# Patient Record
Sex: Female | Born: 1995 | Race: Black or African American | Hispanic: No | Marital: Single
Health system: Midwestern US, Community
[De-identification: ages and names within clinical notes are randomized; demographics above are authoritative.]

## PROBLEM LIST (undated history)

## (undated) DIAGNOSIS — C801 Malignant (primary) neoplasm, unspecified: Secondary | ICD-10-CM

## (undated) HISTORY — PX: ABDOMINAL HYSTERECTOMY: SHX81

## (undated) HISTORY — PX: DILATION AND CURETTAGE OF UTERUS: SHX78

---
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
## 2019-03-22 DIAGNOSIS — Z202 Contact with and (suspected) exposure to infections with a predominantly sexual mode of transmission: Secondary | ICD-10-CM | POA: Insufficient documentation

## 2019-11-02 DIAGNOSIS — C541 Malignant neoplasm of endometrium: Secondary | ICD-10-CM | POA: Insufficient documentation

## 2019-11-02 DIAGNOSIS — C55 Malignant neoplasm of uterus, part unspecified: Secondary | ICD-10-CM | POA: Insufficient documentation

## 2019-11-02 HISTORY — DX: Malignant neoplasm of endometrium: C54.1

## 2019-11-18 DIAGNOSIS — C541 Malignant neoplasm of endometrium: Secondary | ICD-10-CM

## 2019-11-18 HISTORY — DX: Malignant neoplasm of endometrium: C54.1

## 2020-06-08 DIAGNOSIS — Z975 Presence of (intrauterine) contraceptive device: Secondary | ICD-10-CM | POA: Insufficient documentation

## 2021-03-19 ENCOUNTER — Ambulatory Visit
Admission: RE | Admit: 2021-03-19 | Discharge: 2021-03-19 | Disposition: A | Discharge status: Home / Self Care | Payer: No Typology Code available for payment source | Source: Ambulatory Visit | Attending: Emergency Medicine | Admitting: Emergency Medicine

## 2021-03-19 ENCOUNTER — Other Ambulatory Visit: Payer: Self-pay

## 2021-03-19 VITALS — BP 116/80 | HR 98 | Temp 98.4°F | Resp 19

## 2021-03-19 DIAGNOSIS — N760 Acute vaginitis: Secondary | ICD-10-CM

## 2021-03-19 DIAGNOSIS — B9689 Other specified bacterial agents as the cause of diseases classified elsewhere: Secondary | ICD-10-CM

## 2021-03-19 DIAGNOSIS — Z113 Encounter for screening for infections with a predominantly sexual mode of transmission: Secondary | ICD-10-CM | POA: Insufficient documentation

## 2021-03-19 DIAGNOSIS — N898 Other specified noninflammatory disorders of vagina: Secondary | ICD-10-CM | POA: Diagnosis present

## 2021-03-19 HISTORY — DX: Malignant (primary) neoplasm, unspecified: C80.1

## 2021-03-19 LAB — POCT URINALYSIS DIP (MANUAL ENTRY)
Bilirubin, UA: NEGATIVE
Glucose, UA: NEGATIVE mg/dL
Leukocytes, UA: NEGATIVE
Nitrite, UA: NEGATIVE
Protein Ur, POC: NEGATIVE mg/dL
Spec Grav, UA: >=1.03 — AB (ref 1.010–1.025)
Urobilinogen, UA: 0.2 E.U./dL
pH, UA: 5.5 (ref 5.0–8.0)

## 2021-03-19 LAB — POCT URINE PREGNANCY: Preg Test, Ur: NEGATIVE

## 2021-03-19 MED ORDER — METRONIDAZOLE 500 MG PO TABS: 500.0000 mg | ORAL_TABLET | Freq: Two times a day (BID) | ORAL | 0 refills | Status: AC

## 2021-03-19 NOTE — ED Triage Notes (Signed)
Pt presents with complaints of vaginal discharge and odor x 1 week. Denies any urinary symptoms. Reports lower abdominal discomfort.

## 2021-03-19 NOTE — ED Provider Notes (Signed)
HPI  SUBJECTIVE:  Christina Alvarez is a 25 y.o. female who presents with 1 and half weeks of thick, yellow, odorous vaginal discharge.  She reports low abdominal discomfort, denies pain.  No vomiting, fevers, other abdominal pain, low back pain, vaginal bleeding, genital rash, labial swelling, itching.  No urinary complaints.  She is in a long-term monogamous relationship with a female who is asymptomatic.  However she would like to be tested for STDs.  No recent antibiotics.  No perfumed soaps or body washes.  She tried boric acid suppositories with temporary improvement in her symptoms.  No aggravating factors.  She has a past medical history of chlamydia, BV, yeast.  No history of gonorrhea, chlamydia, HIV, HSV, syphilis, trichomonas, PID, UTI, diabetes.  LMP: She is on OCPs.  Denies the possibility being pregnant.  PMD: None.   Past Medical History:  Diagnosis Date  . 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 No pertinent surgical history.  Family History  Problem Relation Age of Onset  . Cancer Mother   . Healthy Father     Social History   Tobacco Use  . Smoking status: Never Smoker  . Smokeless tobacco: Never Used  Substance Use Topics  . Alcohol use: Yes    Comment: weekends  . Drug use: Never    No current facility-administered medications for this encounter.  Current Outpatient Medications:  .  metroNIDAZOLE (FLAGYL) 500 MG tablet, Take 1 tablet (500 mg total) by mouth 2 (two) times daily for 7 days., Disp: 14 tablet, Rfl: 0  No Known Allergies   ROS  As noted in HPI.   Physical Exam  BP 116/80   Pulse 98   Temp 98.4 F (36.9 C)   Resp 19   LMP  (LMP Unknown)   SpO2 98%   Constitutional: Well developed, well nourished, no acute distress Eyes:  EOMI, conjunctiva normal bilaterally HENT: Normocephalic, atraumatic,mucus membranes moist Respiratory: Normal inspiratory effort Cardiovascular: Normal rate GI: nondistended soft, nontender. No suprapubic, flank  tenderness  back: No CVA tenderness GU: Deferred skin: No rash, skin intact Musculoskeletal: no deformities Neurologic: Alert & oriented x 3, no focal neuro deficits Psychiatric: Speech and behavior appropriate   ED Course   Medications - No data to display  Orders Placed This Encounter  Procedures  . POCT urine pregnancy    Standing Status:   Standing    Number of Occurrences:   1  . POCT urinalysis dipstick    Standing Status:   Standing    Number of Occurrences:   1    Results for orders placed or performed during the hospital encounter of 03/19/21 (from the past 24 hour(s))  POCT urine pregnancy     Status: None   Collection Time: 03/19/21 11:36 AM  Result Value Ref Range   Preg Test, Ur Negative Negative  POCT urinalysis dipstick     Status: Abnormal   Collection Time: 03/19/21 11:36 AM  Result Value Ref Range   Color, UA yellow yellow   Clarity, UA clear clear   Glucose, UA negative negative mg/dL   Bilirubin, UA negative negative   Ketones, POC UA trace (5) (A) negative mg/dL   Spec Grav, UA >=1.030 (A) 1.010 - 1.025   Blood, UA trace-intact (A) negative   pH, UA 5.5 5.0 - 8.0   Protein Ur, POC negative negative mg/dL   Urobilinogen, UA 0.2 0.2 or 1.0 E.U./dL   Nitrite, UA Negative Negative   Leukocytes,  UA Negative Negative   No results found.  ED Clinical Impression  1. BV (bacterial vaginosis)   2. Screening for STD (sexually transmitted disease)      ED Assessment/Plan  Urine pregnancy negative.  Trace hematuria.  No UTI.  H&P most c/w  BV. Sent off GC/chlamydia, wet prep. Will not treat empirically now.  Will send home with  Flagyl. Advised pt to refrain from sexual contact until she  knows lab results, symptoms resolve, and partner(s) are treated if necessary. Pt provided working phone number. Follow-up with PMD of choice  as needed.  Provide primary care list for ongoing care and order assistance in finding a PMD.  Discussed labs, MDM, plan and  followup with patient. Pt agrees with plan.   Meds ordered this encounter  Medications  . metroNIDAZOLE (FLAGYL) 500 MG tablet    Sig: Take 1 tablet (500 mg total) by mouth 2 (two) times daily for 7 days.    Dispense:  14 tablet    Refill:  0    *This clinic note was created using Lobby[employer]. Therefore, there may be occasional mistakes despite careful proofreading.  ?     Melynda Ripple, MD 03/20/21 [phone]

## 2021-03-19 NOTE — Discharge Instructions (Addendum)
Finish Flagyl, even if you feel better.  No alcohol while taking the Flagyl.  Give [place] a working phone number so that we can contact you if needed. Refrain from sexual contact until you know your results and your partner(s) are treated if necessary.   Below is a list of primary care practices who are taking new patients for you to follow-up with.  [facility] internal medicine clinic Ground Floor - [facility], [place], [place], [place] [address] [phone]  [facility] Primary Care at [facility] [address] [place], [place] [address] [phone]  [place] and [facility] [place], [place] [address] [phone]  [facility]/Family Medicine/Internal Medicine [phone] [place] [address]  [facility]: [place]  [phone]  [place] [facility]: [place]   [phone]  [facility] Family Medicine: [address] [place]  [phone]  [place] primary care : [address] [phone]  [facility] Primary Care: [address] [place]  [address] [phone]  [facility]: [place] [phone]  Dr. Blanchie Serve [place]  [phone]  Dr. Benito Mccreedy, [facility]. [place], [place] [address]  [phone]  Go to www.goodrx.com to look up your medications. This will give you a list of where you can find your prescriptions at the most affordable prices. Or ask the pharmacist what the cash price is, or if they have any other discount programs available to help make your medication more affordable. This can  be less expensive than what you would pay with insurance.   Go to www.goodrx.com to look up your medications. This will give you a list of where you can find your prescriptions at the most affordable prices. Or ask the pharmacist what the cash price is, or if they have any other discount programs available to help make your medication more affordable. This can be less expensive than what you would pay with insurance.

## 2021-03-20 ENCOUNTER — Telehealth (HOSPITAL_COMMUNITY): Payer: Self-pay | Admitting: Emergency Medicine

## 2021-03-20 LAB — CERVICOVAGINAL ANCILLARY ONLY
Bacterial Vaginitis (gardnerella): POSITIVE — AB
Candida Glabrata: NEGATIVE
Candida Vaginitis: POSITIVE — AB
Chlamydia: NEGATIVE
Comment: NEGATIVE
Comment: NEGATIVE
Comment: NEGATIVE
Comment: NEGATIVE
Comment: NEGATIVE
Comment: NORMAL
Neisseria Gonorrhea: NEGATIVE
Trichomonas: NEGATIVE

## 2021-03-20 MED ORDER — FLUCONAZOLE 150 MG PO TABS: 150.0000 mg | ORAL_TABLET | Freq: Once | ORAL | 0 refills | Status: AC

## 2021-06-20 ENCOUNTER — Telehealth: Payer: Self-pay | Admitting: *Deleted

## 2021-06-20 NOTE — Telephone Encounter (Signed)
Spoke with the patient and scheduled a new patient appt for 7/15 with DrTucker

## 2021-06-20 NOTE — Telephone Encounter (Signed)
Spoke with the patient and scheduled a new patient appt with Dr Berline Lopes on 7/15. Patient given the address and phone number for the clinic; along with the policy for mask and visitors

## 2021-06-26 LAB — HM PAP SMEAR

## 2021-06-28 ENCOUNTER — Encounter: Payer: Self-pay | Admitting: Gynecologic Oncology

## 2021-06-29 ENCOUNTER — Inpatient Hospital Stay: Payer: No Typology Code available for payment source | Attending: Gynecologic Oncology | Admitting: Gynecologic Oncology

## 2021-06-29 ENCOUNTER — Other Ambulatory Visit: Payer: Self-pay

## 2021-06-29 ENCOUNTER — Telehealth: Payer: Self-pay | Admitting: *Deleted

## 2021-06-29 ENCOUNTER — Encounter: Payer: Self-pay | Admitting: Gynecologic Oncology

## 2021-06-29 VITALS — BP 115/70 | HR 104 | Temp 97.8°F | Resp 18 | Ht 60.0 in | Wt 150.0 lb

## 2021-06-29 DIAGNOSIS — M546 Pain in thoracic spine: Secondary | ICD-10-CM | POA: Insufficient documentation

## 2021-06-29 DIAGNOSIS — Z975 Presence of (intrauterine) contraceptive device: Secondary | ICD-10-CM | POA: Insufficient documentation

## 2021-06-29 DIAGNOSIS — C541 Malignant neoplasm of endometrium: Secondary | ICD-10-CM

## 2021-06-29 DIAGNOSIS — Z800 Family history of malignant neoplasm of digestive organs: Secondary | ICD-10-CM | POA: Insufficient documentation

## 2021-06-29 NOTE — Addendum Note (Signed)
Addended by: Joylene John D on: 06/29/2021 02:46 PM   Modules accepted: Orders

## 2021-06-29 NOTE — Patient Instructions (Signed)
It was a pleasure meeting you today!  I will call you when I get your biopsy results back, likely next week.  I have put in the request for a consultation with one of our genetic counselors.  You will be notified about a visit with them.  I think this is important to revisit whether you would like to pursue genetic testing now with your mom's new cancer diagnosis.  If the biopsy results confirm no precancer or cancerous tissue, then we will plan on biopsies every year unless something changes, such as you develop increased vaginal bleeding or other symptoms.  I will plan to see you in 6 months to check in.  My schedule is not out past December.  Please call towards the end of the year to get a visit scheduled with me in mid January.  Clinic phone number: [phone]

## 2021-06-29 NOTE — Telephone Encounter (Signed)
Called and scheduled the patient for a genetics and lab for 7/25

## 2021-06-29 NOTE — Progress Notes (Signed)
GYNECOLOGIC ONCOLOGY NEW PATIENT CONSULTATION   Patient Name: Christina Alvarez  Patient Age: 25 y.o. Date of Service: 06/29/21 Referring Provider: Dr. 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  The patient is overall doing well without evidence of disease on exam today.  Given that it has been a year since her last biopsy, I recommended endometrial sampling today.  This was performed in clinic without difficulty.  I will call her with the results once biopsy is back, likely next week.  We discussed that there is not great data with regard to how frequently biopsy should be performed.  For monitoring, she understands that there is no recommend standard of care.  Typically base my surveillance on GOG 224 protocol, which include endometrial biopsies every 3 months at the start, with consideration of biopsies every 3-6 months following initiation of treatment until a minimum of 3 negative biopsy results are obtained.  After that time, and less bleeding has changed, I recommend continued sampling yearly until the patient is ready to attempt pregnancy.  Given her negative biopsy a year ago, we discussed that if biopsy today is negative for any evidence of cancer, I think we could begin yearly sampling and unless she starts having new symptoms or increased bleeding.  I recommend visits with me every 6 months.  She was amenable to this plan.  At the present time, she does not have plans for attempting pregnancy.  We discussed the role that excess estrogen plays within the pathogenesis of endometrial cancer.  The patient has gained about 30 pounds in less than 2 years.  She is motivated to work towards weight loss.  We  discussed some strategies for this today.  I remain concerned that she may have been at increased risk for development of endometrial cancer related to genetics.  This is now especially true in the setting of her mother having been diagnosed with colon cancer since the patient's own diagnosis.  She met with a genetic counselor at the time of her initial diagnosis but did not pursue genetic testing.  I discussed my worry with her today and asked if she would be open to meeting with one of our genetic counselors.  She is amenable to proceeding with referral to genetics, and this referral was placed today.  I was reassuring about her pelvic cramping.  Per her outside ultrasound, her endometrial lining is thin and her IUD appears appropriately positioned.  I have asked her to keep a diary of her pain.  In regards to her back pain, I believe that this is most likely related to her weight gain.  We discussed that weight loss and decrease in breast weight may help alleviate some of the stress that has been placed on her upper back with her weight gain.  A copy of this note was sent to the patient's referring provider.   65 minutes of total time was spent for this patient encounter, including preparation, face-to-face counseling with the patient and coordination of care, and documentation of the encounter.  Jeral Pinch, MD  Division of Gynecologic Oncology  Department of Obstetrics and Gynecology  [facility]  ___________________________________________  Chief Complaint: Chief Complaint  Patient presents with   Endometrial ca [facility])  History of Present Illness:  Delayla Alvarez is a 25 y.o. y.o. female who is seen in consultation at the request of Dr. Mardelle Matte for an evaluation of a history of endometrial cancer undergoing fertility sparing treatment.  The patient's cancer history is outlined below.  Briefly, she presented in 2020 with reports of 4 months of persistent  vaginal bleeding.  After a pelvic ultrasound and Pap test were reportedly normal, patient underwent endometrial biopsy that was suspicious for cancer.  In October 2020, she underwent D&C confirming endometrial cancer.  She has been treated with a Mirena IUD since that time, and she was additionally on Megace until she moved approximately 9 months ago.  The patient moved from [place] to [facility] 9 months ago from work (she works in Armed forces training and education officer).  She notes having occasional vaginal spotting, which happens every 4 months or less frequently.  This is described as spotting on the toilet paper when she wipes after voiding.  No bleeding on pad or underwear.  She has had some cramping and pelvic pain with that feels like menstrual cramping that she notices when she sits for a long time.  She endorses a good appetite without nausea or emesis.  She reports normal bowel and bladder function.  Recently, she has noted some pain in her upper back.  She attributes this to gaining about 30 pounds from the time of her diagnosis just under 2 years ago.  Had a significant increase in her breast size with this weight gain.  Patient's family history is changed recently with her mother being diagnosed with colon cancer.  She is undergoing chemotherapy currently.  Patient lives in [place] by herself.  She denies any tobacco or recreational drug use, she reports occasional and social alcohol use.  The patient was last seen by Dr. Boyce Medici in [place] in October 2021.  Plan at that time was to continue treatment with Megace and her Mirena IUD with a follow-up pelvic ultrasound in 3 months.  More recently, the patient presented to establish gynecologic care secondary to her pelvic cramping.  Her exam was normal at this visit at the end of June and a pelvic ultrasound showed a thin endometrial lining with mildly enlarged ovaries and findings consistent with polycystic ovaries.  Treatment History: Oncology History  Overview Note  IHC MMR intact ER +, PR +, patchy p16 +, HR HPV negative Had genetics consult in [place], did not undergo testing    Malignant neoplasm of endometrium ([place])  06/28/2019 Imaging   [facility]: Uterus measures 8 x 4.1 x 2.8 cm with an endometrial lining of 3.6 mm.  Bilateral ovaries with polycystic ovary appearance.   10/09/2019 Initial Biopsy   EMB: CAH, suspicious for [facility]   10/15/2019 Pathology Results   EMB: Grade 1 EMCA   11/02/2019 Initial Diagnosis   Malignant neoplasm of endometrium ([place])   11/09/2019 Imaging   MRI pelvis: no evidence of myometrial invasion   11/22/2019 Surgery   D&C, Linus Orn IUD placement Pathology: focal residual EMCA with treatment effect   06/08/2020 Surgery   D&C, Mirena IUD replacement Pathology: scant pieces of benign endometrium with treatment effect     PAST MEDICAL HISTORY:  Past Medical History:  Diagnosis Date   Endometrial cancer ([place]) 11/18/2019   Dr. Lenon Oms [facility]     PAST SURGICAL HISTORY:  Past Surgical History:  Procedure Laterality Date   DILATION AND CURETTAGE OF UTERUS     x2  OB/GYN HISTORY:  OB History  Gravida Para Term Preterm AB Living  0 0 0 0 0 0  SAB IAB Ectopic Multiple Live Births  0 0 0 0 0    No LMP recorded. (Menstrual status: Oral contraceptives).  Age at menarche: [age] Age at menopause: n/a Hx of HRT: n/a Hx of STDs: Yes, was treated for chlamydia in 2020 Last pap: Per patient's report, she had a Pap test in 2021.  Per her chart review, it looks like she had 1 in 2019 or 2020. History of abnormal pap smears: Denies  MEDICATIONS: Outpatient Encounter Medications as of 06/29/2021  Medication Sig   levonorgestrel (MIRENA) 20 MCG/DAY IUD 1 each by Intrauterine route once.   No facility-administered encounter medications on file as of 06/29/2021.    ALLERGIES:  No Known Allergies   FAMILY HISTORY:  Family History   Problem Relation Age of Onset   Colon cancer Mother    Diabetes Father    Healthy Father    Diabetes Paternal Grandmother    Ovarian cancer Neg Hx    Breast cancer Neg Hx    Endometrial cancer Neg Hx    Prostate cancer Neg Hx    Pancreatic cancer Neg Hx      SOCIAL HISTORY:  Social Connections: Not on file    REVIEW OF SYSTEMS:  + pelvic pain, muscle/back pain Denies appetite changes, fevers, chills, fatigue, unexplained weight changes. Denies hearing loss, neck lumps or masses, mouth sores, ringing in ears or voice changes. Denies cough or wheezing.  Denies shortness of breath. Denies chest pain or palpitations. Denies leg swelling. Denies abdominal distention, pain, blood in stools, constipation, diarrhea, nausea, vomiting, or early satiety. Denies pain with intercourse, dysuria, frequency, hematuria or incontinence. Denies hot flashes, vaginal bleeding or vaginal discharge.   Denies joint pain. Denies itching, rash, or wounds. Denies dizziness, headaches, numbness or seizures. Denies swollen lymph nodes or glands, denies easy bruising or bleeding. Denies anxiety, depression, confusion, or decreased concentration.  Physical Exam:  Vital Signs for this encounter:  Blood pressure 115/70, pulse (!) 104, temperature 97.8 F (36.6 C), temperature source Tympanic, resp. rate 18, height 5' (1.524 m), weight 150 lb (68 kg), SpO2 100 %. Body mass index is 29.29 kg/m. General: Alert, oriented, no acute distress.  HEENT: Normocephalic, atraumatic. Sclera anicteric.  Chest: Clear to auscultation bilaterally. No wheezes, rhonchi, or rales. Cardiovascular: Regular rate and rhythm, no murmurs, rubs, or gallops.  Abdomen: Obese. Normoactive bowel sounds. Soft, nondistended, nontender to palpation. No masses or hepatosplenomegaly appreciated. No palpable fluid wave.  Extremities: Grossly normal range of motion. Warm, well perfused. No edema bilaterally.  Skin: No rashes or lesions.   Lymphatics: No cervical, supraclavicular, or inguinal adenopathy.  GU:  Normal external female genitalia. No lesions. No discharge or bleeding.             Bladder/urethra:  No lesions or masses, well supported bladder             Vagina: Well rugated, no lesions or masses.             Cervix: Normal appearing, no lesions.             Uterus: 8-10cm, mobile, no parametrial involvement or nodularity.             Adnexa: No masses appreciated.  Endometrial biopsy procedure Preoperative diagnosis: History of endometrial cancer undergoing treatment with progesterone Postoperative diagnosis: Same as above Procedure: Endometrial biopsy Physician: Berline Lopes Specimens: Endometrium  Estimated blood loss: Minimal The procedure was explained.  The risks and benefits were discussed with the patient and she gave verbal consent.  She was placed in dorsolithotomy and the speculum was placed in the vagina.  The cervix well visualized and was prepped with Betadine x3.  A single-tooth tenaculum was placed on the anterior lip of the uterus. The uterus sounded to 8 cm.  An endometrial Pipelle was advanced to the uterine fundus and 1 pass was performed with scant but sufficient tissue.  This was placed in formalin to be sent to pathology.  Tenaculum was removed from the cervix and hemostasis assured.  All other instruments removed from the vagina. The patient tolerated procedure.   LABORATORY AND RADIOLOGIC DATA:  Outside medical records were reviewed to synthesize the above history, along with the history and physical obtained during the visit.   No results found for: WBC, HGB, HCT, PLT, GLUCOSE, CHOL, TRIG, HDL, LDLDIRECT, LDLCALC, ALT, AST, NA, K, CL, CREATININE, BUN, CO2, TSH, PSA, INR, GLUF, HGBA1C, MICROALBUR

## 2021-07-02 ENCOUNTER — Telehealth: Payer: Self-pay | Admitting: Gynecologic Oncology

## 2021-07-02 LAB — SURGICAL PATHOLOGY

## 2021-07-02 NOTE — Telephone Encounter (Signed)
Called patient, reviewed biopsy results which shows no hyperplasia or malignancy.  We will plan on visits every 6 months and repeat biopsy in a year unless something changes from a bleeding or symptom standpoint.  Patient pleased with the news, voices understanding of plan.  Valarie Cones MD

## 2021-07-09 ENCOUNTER — Inpatient Hospital Stay: Payer: No Typology Code available for payment source

## 2021-07-09 ENCOUNTER — Inpatient Hospital Stay: Payer: No Typology Code available for payment source | Admitting: Genetic Counselor

## 2021-07-10 ENCOUNTER — Ambulatory Visit: Payer: No Typology Code available for payment source | Attending: Internal Medicine

## 2021-07-10 ENCOUNTER — Other Ambulatory Visit (HOSPITAL_BASED_OUTPATIENT_CLINIC_OR_DEPARTMENT_OTHER): Payer: Self-pay

## 2021-07-10 ENCOUNTER — Other Ambulatory Visit: Payer: Self-pay

## 2021-07-10 DIAGNOSIS — Z23 Encounter for immunization: Secondary | ICD-10-CM

## 2021-07-10 MED ORDER — JANSSEN COVID-19 VACCINE 0.5 ML IM SUSP
INTRAMUSCULAR | 0 refills | Status: DC
  Filled 2021-07-10: qty 0.5, 1d supply, fill #0

## 2021-07-10 NOTE — Progress Notes (Signed)
   Covid-19 Vaccination Clinic  Name:  Christina Alvarez    MRN: [id number] DOB: [birth year]  07/10/2021  [name] was observed post Covid-19 immunization for 15 minutes without incident. She was provided with Vaccine Information Sheet and instruction to access the V-Safe system.   [name] was instructed to call 911 with any severe reactions post vaccine: Difficulty breathing  Swelling of face and throat  A fast heartbeat  A bad rash all over body  Dizziness and weakness   Immunizations Administered     Name Date Dose VIS Date Route   JANSSEN COVID-19 VACCINE 07/10/2021 11:58 AM 0.5 mL 10/04/2020 Intramuscular   Manufacturer: [employer]   Lot: [id number]   [place]: [phone]

## 2021-11-13 ENCOUNTER — Telehealth: Payer: Self-pay

## 2021-11-13 NOTE — Telephone Encounter (Signed)
Received call from patients mother Christina Alvarez. She is requesting a follow up appointment for [facility] She reports Christina Alvarez has been having cramping and bleeding for a few days unrelated to her period. She is not sure how much bleeding Christina Alvarez is having. Appointment scheduled with Dr. Berline Lopes for tomorrow 11/14/21 at 3:45pm. Christina Alvarez is in agreement of date and time of appointment. Instructed to call with any questions or concerns.

## 2021-11-14 ENCOUNTER — Inpatient Hospital Stay (HOSPITAL_BASED_OUTPATIENT_CLINIC_OR_DEPARTMENT_OTHER): Payer: No Typology Code available for payment source | Admitting: Gynecologic Oncology

## 2021-11-14 ENCOUNTER — Telehealth: Payer: Self-pay | Admitting: Gynecologic Oncology

## 2021-11-14 ENCOUNTER — Other Ambulatory Visit: Payer: Self-pay

## 2021-11-14 ENCOUNTER — Encounter: Payer: Self-pay | Admitting: Gynecologic Oncology

## 2021-11-14 ENCOUNTER — Inpatient Hospital Stay: Payer: No Typology Code available for payment source | Attending: Gynecologic Oncology

## 2021-11-14 VITALS — BP 116/67 | HR 98 | Temp 98.5°F | Resp 16 | Ht 60.0 in | Wt 156.0 lb

## 2021-11-14 DIAGNOSIS — R519 Headache, unspecified: Secondary | ICD-10-CM | POA: Diagnosis not present

## 2021-11-14 DIAGNOSIS — C541 Malignant neoplasm of endometrium: Secondary | ICD-10-CM | POA: Insufficient documentation

## 2021-11-14 DIAGNOSIS — R102 Pelvic and perineal pain unspecified side: Secondary | ICD-10-CM

## 2021-11-14 DIAGNOSIS — Z79890 Hormone replacement therapy (postmenopausal): Secondary | ICD-10-CM | POA: Insufficient documentation

## 2021-11-14 DIAGNOSIS — R350 Frequency of micturition: Secondary | ICD-10-CM

## 2021-11-14 DIAGNOSIS — Z3202 Encounter for pregnancy test, result negative: Secondary | ICD-10-CM | POA: Insufficient documentation

## 2021-11-14 DIAGNOSIS — Z0000 Encounter for general adult medical examination without abnormal findings: Secondary | ICD-10-CM

## 2021-11-14 LAB — PREGNANCY, URINE: Preg Test, Ur: NEGATIVE

## 2021-11-14 NOTE — Patient Instructions (Signed)
It was good to see you today.  I will call you once I get results back to discuss neck steps.

## 2021-11-14 NOTE — Telephone Encounter (Signed)
Called patient with negative pregnancy test results.  She was relieved with this news.  Jeral Pinch MD Gynecologic Oncology

## 2021-11-14 NOTE — Progress Notes (Signed)
Gynecologic Oncology Return Clinic Visit  11/14/21  Reason for Visit: follow-up in the setting of hormone treatment for clinical stage I endometrial cancer  Treatment History: Oncology History Overview Note  IHC MMR intact ER +, PR +, patchy p16 +, HR HPV negative Had genetics consult in [place], did not undergo testing    Malignant neoplasm of endometrium ([place])  06/28/2019 Imaging   [facility]el[facility] at [facility]: Uterus measures 8 x 4.1 x 2.8 cm with an endometrial lining of 3.6 mm.  Bilateral ovaries with polycystic ovary appearance.   10/09/2019 Initial Biopsy   EMB: CAH, suspicious for [facility]   10/15/2019 Pathology Results   EMB: Grade 1 EMCA   11/02/2019 Initial Diagnosis   Malignant neoplasm of endometrium ([place])   11/09/2019 Imaging   MRI pelvis: no evidence of myometrial invasion   11/22/2019 Surgery   D&C, Linus Orn IUD placement Pathology: focal residual EMCA with treatment effect   06/08/2020 Surgery   D&C, Mirena IUD replacement Pathology: scant pieces of benign endometrium with treatment effect   06/29/2021 Pathology Results   EMB: Polypoid fragments of inactive endometrium with hormone effect.  - Benign squamous cells.  - No hyperplasia or malignancy.    The patient's cancer history is outlined below.  Briefly, she presented in 2020 with reports of 4 months of persistent vaginal bleeding.  After a pelvic ultrasound and Pap test were reportedly normal, patient underwent endometrial biopsy that was suspicious for cancer.  In October 2020, she underwent D&C confirming endometrial cancer.  She has been treated with a Mirena IUD since that time, and she was additionally on Megace until she moved approximately 9 months ago.   The patient moved from [place] to [facility] 9 months ago from work (she works in Armed forces training and education officer).  She notes having occasional vaginal spotting, which happens every 4 months or less frequently.  This is described as spotting on the toilet  paper when she wipes after voiding.  No bleeding on pad or underwear.  She has had some cramping and pelvic pain with that feels like menstrual cramping that she notices when she sits for a long time.  She endorses a good appetite without nausea or emesis.  She reports normal bowel and bladder function.  Interval History: Patient presents today with a couple of weeks of menstrual-like cramping.  This is not daily, but often happens if she sits for long periods at a time.  She has to take ibuprofen or Advil and this resolves her cramping.  She also notes some increased urinary frequency thinks that she is drinking more water.  Even when she drinks a lot of water, she describes her urine is looking dark.  She endorses new headaches, which do not happen frequently but caused sharp pain.  She also notes associated memory issues. She endorses normal bowel function.  She endorses a good appetite without nausea or emesis.  Past Medical/Surgical History: Past Medical History:  Diagnosis Date   Endometrial cancer ([place]) 11/18/2019   Dr. Boyce Medici [facility]    Past Surgical History:  Procedure Laterality Date   DILATION AND CURETTAGE OF UTERUS     x2    Family History  Problem Relation Age of Onset   Colon cancer Mother    Diabetes Father    Healthy Father    Diabetes Paternal Grandmother    Ovarian cancer Neg Hx    Breast cancer Neg Hx    Endometrial cancer Neg Hx  Prostate cancer Neg Hx    Pancreatic cancer Neg Hx     Social History   Socioeconomic History   Marital status: Single    Spouse name: Not on file   Number of children: Not on file   Years of education: Not on file   Highest education level: Not on file  Occupational History   Not on file  Tobacco Use   Smoking status: Never   Smokeless tobacco: Never  Vaping Use   Vaping Use: Never used  Substance and Sexual Activity   Alcohol use: Yes    Comment: weekends   Drug use: Never    Sexual activity: Yes    Birth control/protection: I.U.D.  Other Topics Concern   Not on file  Social History Narrative   Not on file   Social Determinants of Health   Financial Resource Strain: Not on file  Food Insecurity: Not on file  Transportation Needs: Not on file  Physical Activity: Not on file  Stress: Not on file  Social Connections: Not on file    Current Medications:  Current Outpatient Medications:    levonorgestrel (MIRENA) 20 MCG/DAY IUD, 1 each by Intrauterine route once., Disp: , Rfl:   Review of Systems: Pertinent positives as per HPI.  Also endorses decreased concentration. Denies appetite changes, fevers, chills, fatigue, unexplained weight changes. Denies hearing loss, neck lumps or masses, mouth sores, ringing in ears or voice changes. Denies cough or wheezing.  Denies shortness of breath. Denies chest pain or palpitations. Denies leg swelling. Denies abdominal distention, blood in stools, constipation, diarrhea, nausea, vomiting, or early satiety. Denies pain with intercourse, dysuria, frequency, hematuria or incontinence. Denies hot flashes, vaginal bleeding or vaginal discharge.   Denies joint pain, back pain or muscle pain/cramps. Denies itching, rash, or wounds. Denies dizziness, numbness or seizures. Denies swollen lymph nodes or glands, denies easy bruising or bleeding. Denies anxiety, depression, confusion.  Physical Exam: BP 116/67 (BP Location: Right Arm, Patient Position: Sitting)   Pulse 98   Temp 98.5 F (36.9 C)   Resp 16   Ht 5' (1.524 m)   Wt 156 lb (70.8 kg)   SpO2 99%   BMI 30.47 kg/m  General: Alert, oriented, no acute distress. HEENT: Normocephalic, atraumatic, sclera anicteric. Chest: Unlabored breathing on room air. Cardiovascular: Regular rate and rhythm, no murmurs. Abdomen: soft, nontender.  Normoactive bowel sounds.  No masses or hepatosplenomegaly appreciated.   Extremities: Grossly normal range of motion.  Warm,  well perfused.  No edema bilaterally. Skin: No rashes or lesions noted. Lymphatics: No cervical, supraclavicular, or inguinal adenopathy. GU: Normal appearing external genitalia without erythema, excoriation, or lesions.  Speculum exam reveals minimal discharge, vagina well rugated.  Cervix normal in appearance with IUD strings visible at approximately 3 cm.  Bimanual exam reveals cervix and uterus nontender, mobile, no IUD appreciated within the cervical canal.    Laboratory & Radiologic Studies: None new  Assessment & Plan: Christina Alvarez is a 25 y.o. 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  Exam is overall reassuring.  I have suggested getting up pelvic ultrasound to assess the endometrium as well as the IUD location.  Malposition of the IUD could cause some pelvic cramping.  The patient had previously endorsed cramping at her visit with me in July.  Given other symptoms  as well as patient sexual activity, I recommended pregnancy test.  We will also plan to send her urine for urine culture.  If above work-up is negative, then we will have patient come back for endometrial biopsy as well as vaginal swabs to rule out pelvic infection.  Will place referral for patient to establish primary care provider here.  32 minutes of total time was spent for this patient encounter, including preparation, face-to-face counseling with the patient and coordination of care, and documentation of the encounter.  Jeral Pinch, MD  Division of Gynecologic Oncology  Department of Obstetrics and Gynecology  [facility]

## 2021-11-15 ENCOUNTER — Telehealth: Payer: Self-pay | Admitting: Oncology

## 2021-11-15 ENCOUNTER — Telehealth: Payer: Self-pay

## 2021-11-15 LAB — URINE CULTURE: Culture: NO GROWTH

## 2021-11-15 NOTE — Telephone Encounter (Signed)
Spoke with Christina Alvarez this afternoon and reviewed urine culture results. Per Dr. Berline Lopes no infection seen. Patient verbalized understanding.

## 2021-11-15 NOTE — Telephone Encounter (Signed)
[facility] with genetic counseling appointment and primary care appointment.  She verbalized understanding and agreement.

## 2021-11-20 ENCOUNTER — Ambulatory Visit (HOSPITAL_COMMUNITY): Admission: RE | Admit: 2021-11-20 | Payer: No Typology Code available for payment source | Source: Ambulatory Visit

## 2021-11-20 ENCOUNTER — Telehealth: Payer: Self-pay | Admitting: Oncology

## 2021-11-20 NOTE — Telephone Encounter (Signed)
Called Christina Alvarez regarding her missed [place] appointment.  She said she thought it was tomorrow and would like to reschedule.    Rescheduled appointment to 11/27/21.  Advised [name] of new appointment and gave her central scheduling's number in case she needs to change it.  She verbalized understanding and agreement.

## 2021-11-27 ENCOUNTER — Ambulatory Visit
Admission: RE | Admit: 2021-11-27 | Discharge: 2021-11-27 | Disposition: A | Discharge status: Home / Self Care | Payer: No Typology Code available for payment source | Source: Ambulatory Visit | Attending: Gynecologic Oncology | Admitting: Gynecologic Oncology

## 2021-11-27 ENCOUNTER — Other Ambulatory Visit: Payer: Self-pay

## 2021-11-27 DIAGNOSIS — R102 Pelvic and perineal pain: Secondary | ICD-10-CM | POA: Diagnosis present

## 2021-11-28 ENCOUNTER — Telehealth: Payer: Self-pay | Admitting: Gynecologic Oncology

## 2021-11-28 NOTE — Telephone Encounter (Signed)
Called the patient to discuss ultrasound results.  Overall very reassuring.  Endometrium is thin and IUD is in expected position at the top of the uterus.  No masses seen on the ovaries.  Patient continues to have some pelvic cramping.  I discussed that additionally we could do an endometrial biopsy even though lining is thin to rule out any pathology.  She would like to hold off at this time.  We agreed on a phone visit follow-up in about a month.  I have asked her to keep a diary of her symptoms as well as the foods that she is eating.  Jeral Pinch MD Gynecologic Oncology

## 2021-11-29 ENCOUNTER — Other Ambulatory Visit: Payer: Self-pay | Admitting: Genetic Counselor

## 2021-11-29 ENCOUNTER — Encounter: Payer: No Typology Code available for payment source | Admitting: Genetic Counselor

## 2021-11-29 ENCOUNTER — Other Ambulatory Visit: Payer: No Typology Code available for payment source

## 2021-11-29 DIAGNOSIS — C541 Malignant neoplasm of endometrium: Secondary | ICD-10-CM

## 2021-12-19 NOTE — Progress Notes (Signed)
Virtual Visit via Telephone Note  I connected with Christina Alvarez, on 12/26/2021 at 2:09 PM by telephone due to the COVID-19 pandemic and verified that I am speaking with the correct person using two identifiers.  Due to current restrictions/limitations of in-office visits due to the COVID-19 pandemic, this scheduled clinical appointment was converted to a telehealth visit.   Consent: I discussed the limitations, risks, security and privacy concerns of performing an evaluation and management service by telephone and the availability of in person appointments. I also discussed with the patient that there may be a patient responsible charge related to this service. The patient expressed understanding and agreed to proceed.   Location of Patient: Home  Location of Provider: [facility] participating in Telemedicine visit: [name], NP [name], [name]   History of Present Illness: Christina Alvarez is to establish care.   Current issues and/or concerns: None.   Past Medical History:  Diagnosis Date   Endometrial cancer ([place]) 11/18/2019   Dr. Boyce Medici [facility]   No Known Allergies  Current Outpatient Medications on File Prior to Visit  Medication Sig Dispense Refill   levonorgestrel (MIRENA) 20 MCG/DAY IUD 1 each by Intrauterine route once.     No current facility-administered medications on file prior to visit.    Observations/Objective: Alert and oriented x 3. Not in acute distress. Physical examination not completed as this is a telemedicine visit.  Assessment and Plan: 1. Encounter to establish care: - Patient presents today to establish care.  - Return for annual physical examination, labs, and health maintenance. Arrive fasting meaning having no food for at least 8 hours prior to appointment. You may have only water or black coffee. Please take scheduled medications as  normal.   Follow Up Instructions: Return for annual physical exam.    Patient was given clear instructions to go to Emergency Department or return to medical center if symptoms don't improve, worsen, or new problems develop.The patient verbalized understanding.  I discussed the assessment and treatment plan with the patient. The patient was provided an opportunity to ask questions and all were answered. The patient agreed with the plan and demonstrated an understanding of the instructions.   The patient was advised to call back or seek an in-person evaluation if the symptoms worsen or if the condition fails to improve as anticipated.     I provided 5 minutes total of non-face-to-face time during this encounter.   Camillia Herter, NP  [facility] [place], [place] 12/26/2021, 2:09 PM

## 2021-12-26 ENCOUNTER — Encounter: Payer: Self-pay | Admitting: Family

## 2021-12-26 ENCOUNTER — Inpatient Hospital Stay: Payer: No Typology Code available for payment source | Admitting: Gynecologic Oncology

## 2021-12-26 ENCOUNTER — Ambulatory Visit (INDEPENDENT_AMBULATORY_CARE_PROVIDER_SITE_OTHER): Payer: No Typology Code available for payment source | Admitting: Family

## 2021-12-26 ENCOUNTER — Other Ambulatory Visit: Payer: Self-pay

## 2021-12-26 ENCOUNTER — Telehealth: Payer: Self-pay | Admitting: *Deleted

## 2021-12-26 ENCOUNTER — Telehealth: Payer: Self-pay | Admitting: Gynecologic Oncology

## 2021-12-26 DIAGNOSIS — Z7689 Persons encountering health services in other specified circumstances: Secondary | ICD-10-CM | POA: Diagnosis not present

## 2021-12-26 NOTE — Progress Notes (Signed)
Pt presents to establish care, pt from [place], [place]

## 2021-12-26 NOTE — Telephone Encounter (Signed)
Per Dr Berline Lopes patient scheduled for 6/8 at 1 pm. [facility] for patient

## 2021-12-26 NOTE — Telephone Encounter (Signed)
Called patient. She is doing well. Denies any vaginal bleeding or cramping. Will have office schedule her for ofllow-up 6 months after I saw her in November.  Valarie Cones MD

## 2022-04-25 ENCOUNTER — Other Ambulatory Visit: Payer: Self-pay

## 2022-05-23 ENCOUNTER — Other Ambulatory Visit: Payer: Self-pay

## 2022-05-23 ENCOUNTER — Inpatient Hospital Stay: Payer: No Typology Code available for payment source | Attending: Gynecologic Oncology | Admitting: Gynecologic Oncology

## 2022-05-23 DIAGNOSIS — C541 Malignant neoplasm of endometrium: Secondary | ICD-10-CM

## 2022-05-23 NOTE — Patient Instructions (Signed)
It was good to see you today.  I do not see or feel any evidence of cancer recurrence on your exam.  My office will contact you with your endometrial biopsy results.  If the biopsy confirms no evidence of cancer, we will continue with visits every 6 months.  Please call in the late fall to get a visit scheduled with me for December.  If you develop any new and concerning symptoms, please call to see me sooner.

## 2022-05-23 NOTE — Progress Notes (Unsigned)
Gynecologic Oncology Return Clinic Visit  05/23/22  Reason for Visit: follow-up in the setting of hormone treatment for clinical stage I endometrial cancer  Treatment History: Oncology History Overview Note  IHC MMR intact ER +, PR +, patchy p16 +, HR HPV negative Had genetics consult in [place], did not undergo testing    Malignant neoplasm of endometrium ([place])  06/28/2019 Imaging   [facility] Uterus measures 8 x 4.1 x 2.8 cm with an endometrial lining of 3.6 mm.  Bilateral ovaries with polycystic ovary appearance.   10/09/2019 Initial Biopsy   EMB: CAH, suspicious for [facility]   10/15/2019 Pathology Results   EMB: Grade 1 EMCA   11/02/2019 Initial Diagnosis   Malignant neoplasm of endometrium ([place])   11/09/2019 Imaging   MRI pelvis: no evidence of myometrial invasion   11/22/2019 Surgery   D&C, Linus Orn IUD placement Pathology: focal residual EMCA with treatment effect   06/08/2020 Surgery   D&C, Mirena IUD replacement Pathology: scant pieces of benign endometrium with treatment effect   06/29/2021 Pathology Results   EMB: Polypoid fragments of inactive endometrium with hormone effect.  - Benign squamous cells.  - No hyperplasia or malignancy.    The patient's cancer history is outlined below.  Briefly, she presented in 2020 with reports of 4 months of persistent vaginal bleeding.  After a pelvic ultrasound and Pap test were reportedly normal, patient underwent endometrial biopsy that was suspicious for cancer.  In October 2020, she underwent D&C confirming endometrial cancer.  She has been treated with a Mirena IUD since that time, and she was additionally on Megace until she moved approximately 9 months ago.   The patient moved from [place] to [facility] 9 months ago from work (she works in Armed forces training and education officer).  She notes having occasional vaginal spotting, which happens every 4 months or less frequently.  This is described as spotting on the toilet  paper when she wipes after voiding.  No bleeding on pad or underwear.  She has had some cramping and pelvic pain with that feels like menstrual cramping that she notices when she sits for a long time.  She endorses a good appetite without nausea or emesis.  She reports normal bowel and bladder function.  Interval History: ***  Past Medical/Surgical History: Past Medical History:  Diagnosis Date   Endometrial cancer ([place]) 11/18/2019   Dr. Boyce Medici [facility]    Past Surgical History:  Procedure Laterality Date   DILATION AND CURETTAGE OF UTERUS     x2    Family History  Problem Relation Age of Onset   Colon cancer Mother    Diabetes Father    Healthy Father    Diabetes Paternal Grandmother    Ovarian cancer Neg Hx    Breast cancer Neg Hx    Endometrial cancer Neg Hx    Prostate cancer Neg Hx    Pancreatic cancer Neg Hx     Social History   Socioeconomic History   Marital status: Single    Spouse name: Not on file   Number of children: Not on file   Years of education: Not on file   Highest education level: Not on file  Occupational History   Not on file  Tobacco Use   Smoking status: Never   Smokeless tobacco: Never  Vaping Use   Vaping Use: Never used  Substance and Sexual Activity   Alcohol use: Yes    Comment: weekends  Drug use: Never   Sexual activity: Yes    Birth control/protection: I.U.D.  Other Topics Concern   Not on file  Social History Narrative   Not on file   Social Determinants of Health   Financial Resource Strain: Not on file  Food Insecurity: Not on file  Transportation Needs: Not on file  Physical Activity: Not on file  Stress: Not on file  Social Connections: Not on file    Current Medications:  Current Outpatient Medications:    levonorgestrel (MIRENA) 20 MCG/DAY IUD, 1 each by Intrauterine route once., Disp: , Rfl:   Review of Systems: Denies appetite changes, fevers, chills, fatigue,  unexplained weight changes. Denies hearing loss, neck lumps or masses, mouth sores, ringing in ears or voice changes. Denies cough or wheezing.  Denies shortness of breath. Denies chest pain or palpitations. Denies leg swelling. Denies abdominal distention, pain, blood in stools, constipation, diarrhea, nausea, vomiting, or early satiety. Denies pain with intercourse, dysuria, frequency, hematuria or incontinence. Denies hot flashes, pelvic pain, vaginal bleeding or vaginal discharge.   Denies joint pain, back pain or muscle pain/cramps. Denies itching, rash, or wounds. Denies dizziness, headaches, numbness or seizures. Denies swollen lymph nodes or glands, denies easy bruising or bleeding. Denies anxiety, depression, confusion, or decreased concentration.  Physical Exam: There were no vitals taken for this visit. General: ***Alert, oriented, no acute distress. HEENT: ***Posterior oropharynx clear, sclera anicteric. Chest: ***Clear to auscultation bilaterally.  ***Port site clean. Cardiovascular: ***Regular rate and rhythm, no murmurs. Abdomen: ***Obese, soft, nontender.  Normoactive bowel sounds.  No masses or hepatosplenomegaly appreciated.  ***Well-healed scar. Extremities: ***Grossly normal range of motion.  Warm, well perfused.  No edema bilaterally. Skin: ***No rashes or lesions noted. Lymphatics: ***No cervical, supraclavicular, or inguinal adenopathy. GU: Normal appearing external genitalia without erythema, excoriation, or lesions.  Speculum exam reveals ***.  Bimanual exam reveals ***.  ***Rectovaginal exam  confirms ___.  Laboratory & Radiologic Studies: Pelvic ultrasound 11/27/21: Uterus Measurements: 8.9 x 2.8 x 4.2 cm = volume: 55 mL. Anteverted. Normal morphology without mass Endometrium Thickness: 3 mm. IUD in expected position at upper uterine segment endometrial canal. Trace endometrial fluid. No gross mass identified. Right ovary Measurements: 4.2 x 2.6 x 2.9 cm  = volume: 16.1 mL. Normal morphology without mass Left ovary Measurements: 3.4 x 2.4 x 3.2 cm = volume: 13.4 mL. Normal morphology without mass Other findings No free pelvic fluid.  No adnexal masses. IMPRESSION: IUD in expected position at the upper uterine segment endometrial canal. Otherwise normal exam.  Assessment & Plan: Christina Alvarez is a 26 y.o. 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   Exam is overall reassuring.  I have suggested getting up pelvic ultrasound to assess the endometrium as well as the IUD location.  Malposition of the IUD could cause some pelvic cramping.  The patient had previously endorsed cramping at her visit with me in July.   Given other symptoms as well as patient sexual activity, I recommended pregnancy test.  We will also plan to send her urine for urine culture.   If above work-up is negative, then we will have patient come back for endometrial biopsy as well as vaginal swabs to rule out pelvic infection.   Will place referral for patient to establish primary care provider here.  The patient is overall doing well without evidence of disease on exam today.  Given that it has been a year since her last biopsy, I recommended endometrial sampling today.  This was performed in clinic without difficulty.  I will call her with the results once biopsy is back, likely next week.  We discussed that there is not great data with regard to how frequently biopsy should be performed.  For monitoring, she understands that there is no recommend standard of care.  Typically base my surveillance on GOG 224 protocol, which include endometrial biopsies every 3 months at the start, with consideration of biopsies every 3-6 months following initiation of  treatment until a minimum of 3 negative biopsy results are obtained.  After that time, and less bleeding has changed, I recommend continued sampling yearly until the patient is ready to attempt pregnancy.  Given her negative biopsy a year ago, we discussed that if biopsy today is negative for any evidence of cancer, I think we could begin yearly sampling and unless she starts having new symptoms or increased bleeding.  I recommend visits with me every 6 months.  She was amenable to this plan.  At the present time, she does not have plans for attempting pregnancy.   We discussed the role that excess estrogen plays within the pathogenesis of endometrial cancer.  The patient has gained about 30 pounds in less than 2 years.  She is motivated to work towards weight loss.  We discussed some strategies for this today.   I remain concerned that she may have been at increased risk for development of endometrial cancer related to genetics.  This is now especially true in the setting of her mother having been diagnosed with colon cancer since the patient's own diagnosis.  She met with a genetic counselor at the time of her initial diagnosis but did not pursue genetic testing.  I discussed my worry with her today and asked if she would be open to meeting with one of our genetic counselors.  She is amenable to proceeding with referral to genetics, and this referral was placed today.   I was reassuring about her pelvic cramping.  Per her outside ultrasound, her endometrial lining is thin and her IUD appears appropriately positioned.  I have asked her to keep a diary of her pain.  In regards to her back pain, I believe that this is most likely related to her weight gain.  We discussed that weight loss and decrease in breast weight may help alleviate some of the stress that has been placed on her upper back with her weight gain.  *** minutes of total time was spent for this patient encounter, including preparation,  face-to-face counseling with the patient and coordination of care, and documentation of the encounter.  Jeral Pinch, MD  Division of Gynecologic Oncology  Department of Obstetrics and Gynecology  [facility]

## 2022-08-11 IMAGING — US US PELVIS COMPLETE WITH TRANSVAGINAL
1 series · 15 of 25 positions shown · non-contrast
Comparison: None

CLINICAL DATA: Uterine cramping, has Mirena IUD, reported history
of "endometrial cancer"; unknown LMP

EXAM:
TRANSABDOMINAL AND TRANSVAGINAL ULTRASOUND OF PELVIS
TECHNIQUE: Both transabdominal and transvaginal ultrasound examinations of the
pelvis were performed. Transabdominal technique was performed for
global imaging of the pelvis including uterus, ovaries, adnexal
regions, and pelvic cul-de-sac. It was necessary to proceed with
endovaginal exam following the transabdominal exam to visualize the
endometrium and IUD.

[Series 1: us pelvis complete with transvaginal · 15 of 87 slices shown]
[im 1/87]
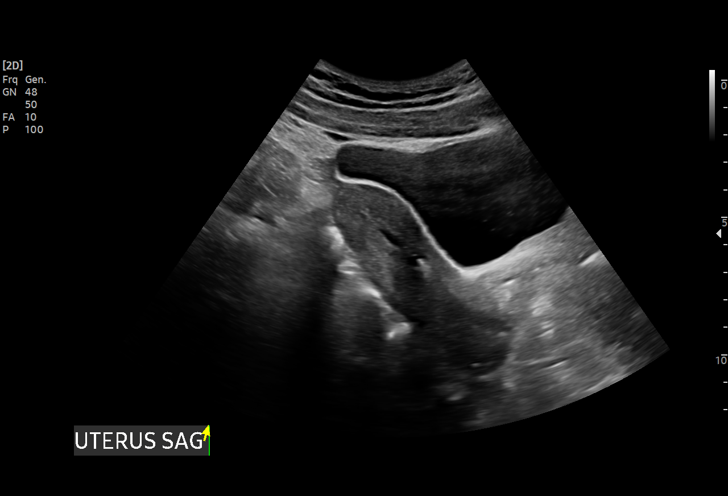
[im 8/87]
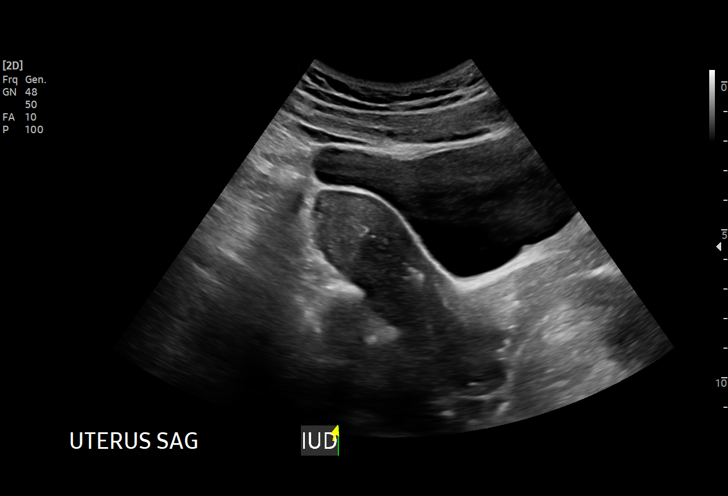
[im 15/87]
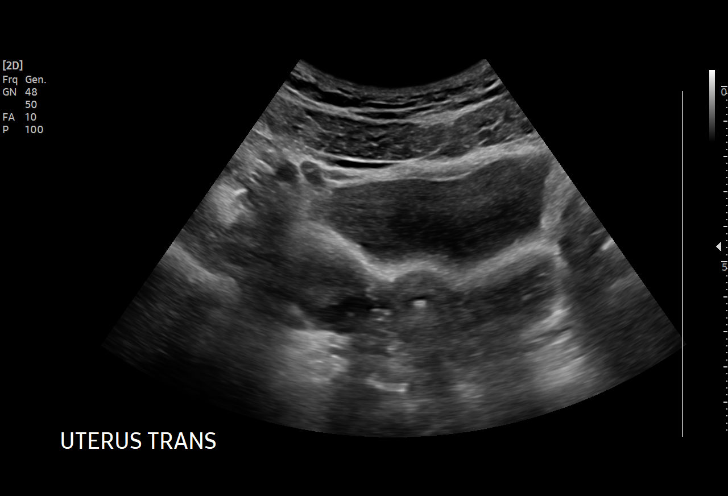
[im 18/87]
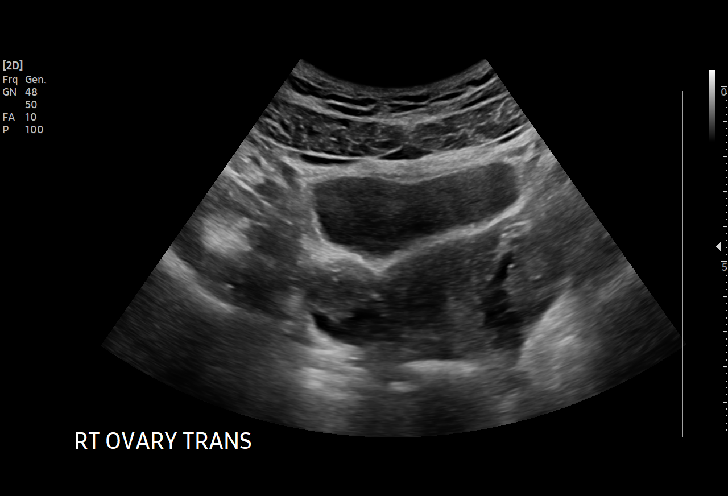
[im 26/87]
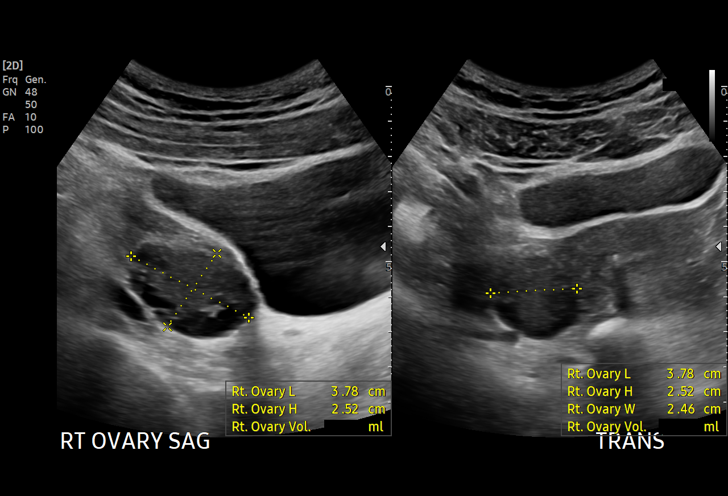
[im 33/87]
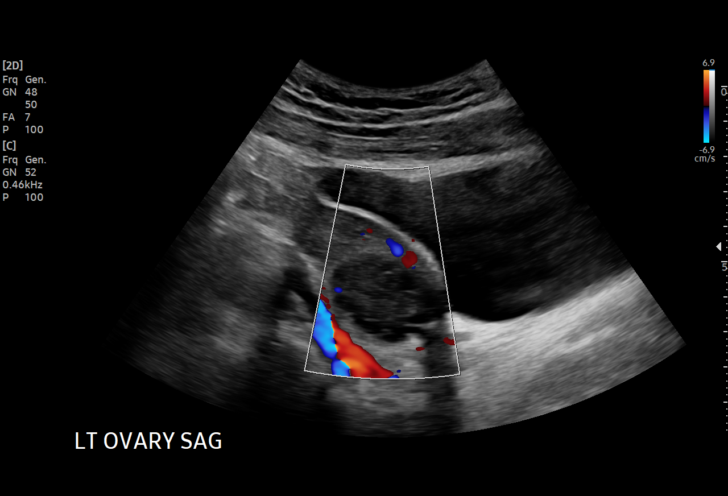
[im 36/87]
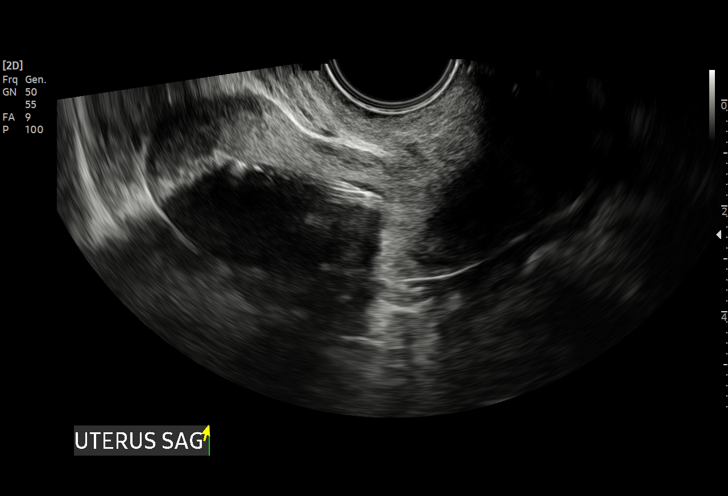
[im 44/87]
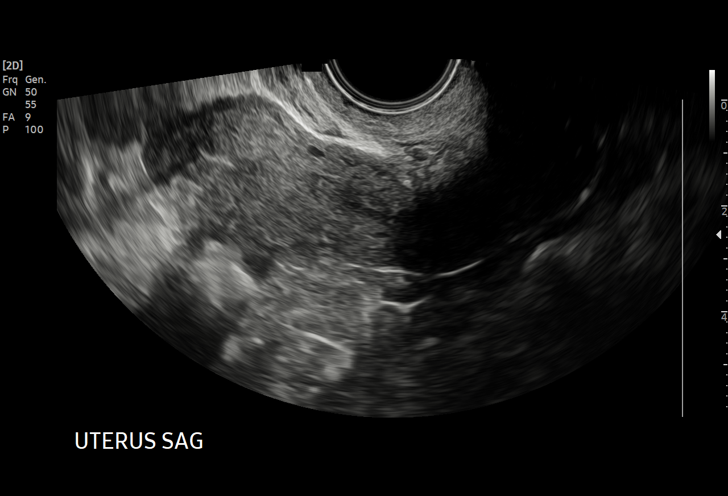
[im 51/87]
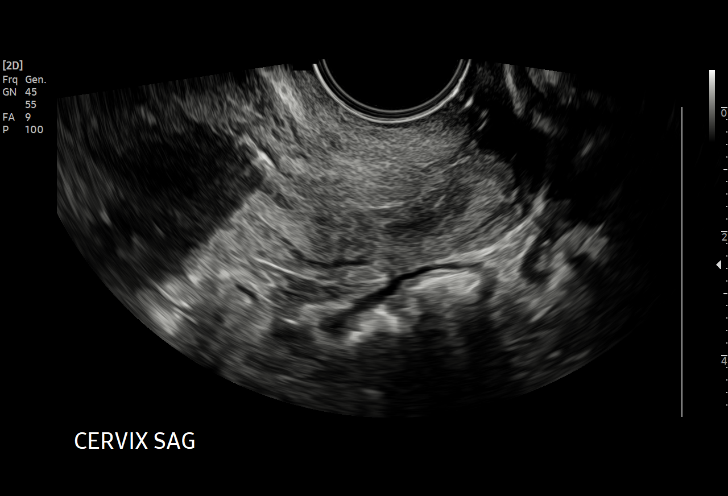
[im 54/87]
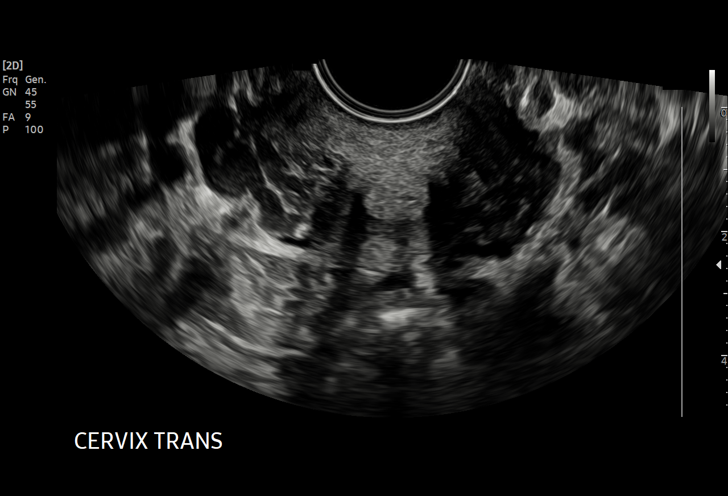
[im 61/87]
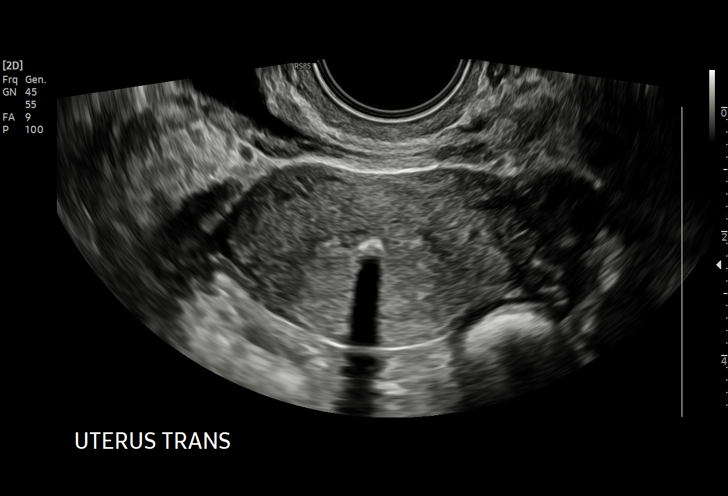
[im 69/87]
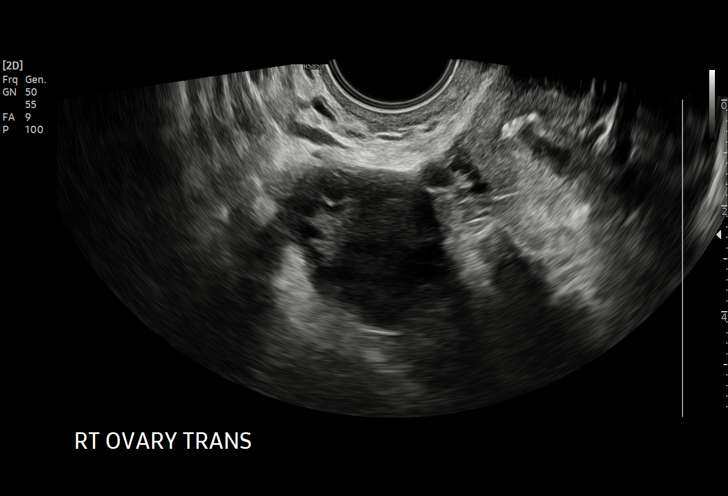
[im 72/87]
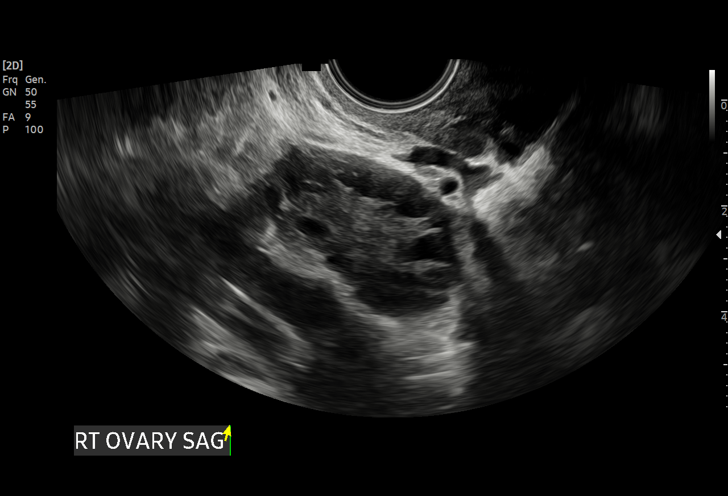
[im 79/87]
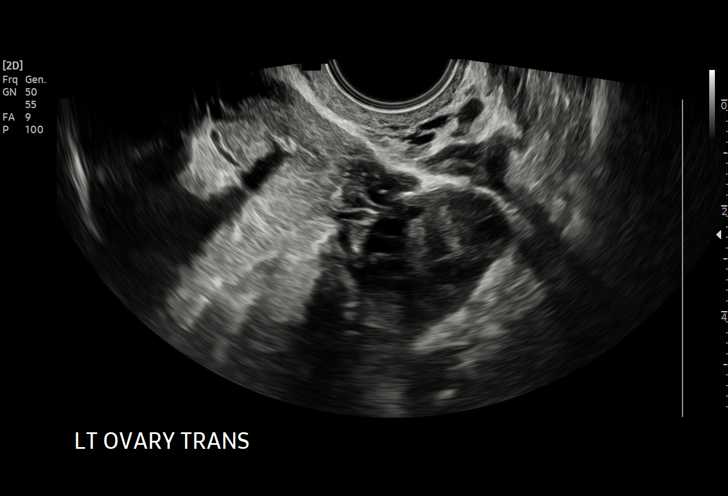
[im 87/87]
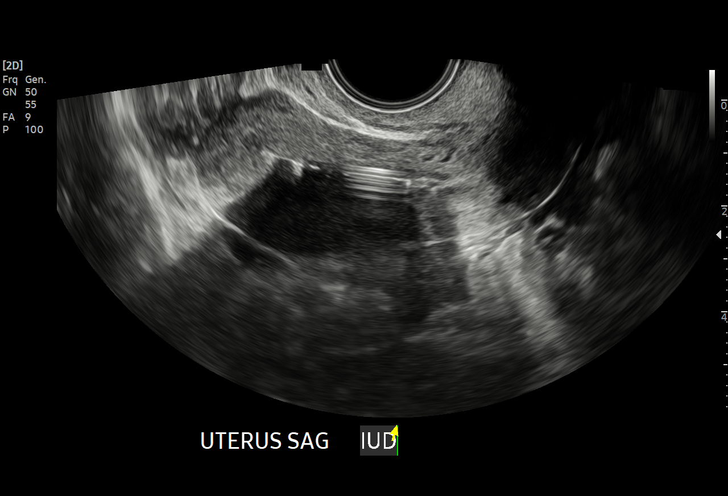

[15 of 25 positions shown; findings below may reference images not displayed]

FINDINGS: Uterus

Measurements: 8.9 x 2.8 x 4.2 cm = volume: 55 mL. Anteverted. Normal
morphology without mass

Endometrium

Thickness: 3 mm. IUD in expected position at upper uterine segment
endometrial canal. Trace endometrial fluid. No gross mass
identified.

Right ovary

Measurements: 4.2 x 2.6 x 2.9 cm = volume: 16.1 mL. Normal
morphology without mass

Left ovary

Measurements: 3.4 x 2.4 x 3.2 cm = volume: 13.4 mL. Normal
morphology without mass

Other findings

No free pelvic fluid.  No adnexal masses.
IMPRESSION: IUD in expected position at the upper uterine segment endometrial
canal.

Otherwise normal exam.

## 2023-01-08 ENCOUNTER — Ambulatory Visit: Payer: No Typology Code available for payment source | Admitting: Nurse Practitioner

## 2023-01-08 ENCOUNTER — Encounter: Payer: Self-pay | Admitting: Nurse Practitioner

## 2023-01-08 VITALS — BP 104/70 | HR 93 | Temp 97.5°F | Ht 62.0 in | Wt 145.4 lb

## 2023-01-08 DIAGNOSIS — M546 Pain in thoracic spine: Secondary | ICD-10-CM | POA: Diagnosis not present

## 2023-01-08 DIAGNOSIS — Z13220 Encounter for screening for lipoid disorders: Secondary | ICD-10-CM

## 2023-01-08 DIAGNOSIS — H538 Other visual disturbances: Secondary | ICD-10-CM

## 2023-01-08 DIAGNOSIS — Z8542 Personal history of malignant neoplasm of other parts of uterus: Secondary | ICD-10-CM | POA: Insufficient documentation

## 2023-01-08 DIAGNOSIS — G8929 Other chronic pain: Secondary | ICD-10-CM

## 2023-01-08 LAB — HEMOGLOBIN A1C: Hgb A1c MFr Bld: 5.6 % (ref 4.6–6.5)

## 2023-01-08 NOTE — Assessment & Plan Note (Signed)
Chronic, ongoing.  She has been having upper back pain for the past several years.  She states when she started the Megace, her breasts have increased in size.  Will have her start back stretches daily.  She can also take ibuprofen or Tylenol as needed for pain.  Follow-up in 3 months.

## 2023-01-08 NOTE — Assessment & Plan Note (Signed)
Encouraged her to schedule an appointment with her GYN in [place] or establish with a provider in [place].  She states that she was taking Megace and has an IUD inserted.  She also was treated with 2 D&Cs.

## 2023-01-08 NOTE — Patient Instructions (Signed)
It was great to see you!  We are checking your labs today and will let you know the results via mychart/phone.   Start the stretches for your upper back daily. You can take tylenol or ibuprofen as needed for pain.   Let's follow-up in 3 months, sooner if you have concerns.  If a referral was placed today, you will be contacted for an appointment. Please note that routine referrals can sometimes take up to 3-4 weeks to process. Please call our office if you haven't heard anything after this time frame.  Take care,  Vance Peper, NP

## 2023-01-08 NOTE — Progress Notes (Signed)
New Patient Visit  BP 104/70   Pulse 93   Temp (!) 97.5 F (36.4 C)   Ht '5\' 2"'$  (1.575 m)   Wt 145 lb 6.4 oz (66 kg)   SpO2 98%   BMI 26.59 kg/m    Subjective:    Patient ID: Christina Alvarez, female    DOB: 11-12-1996, 27 y.o.   MRN: [id number]  CC: Chief Complaint  Patient presents with   New Patient (Initial Visit)    Est care , discuss family hx of diabetes     HPI: Christina Alvarez is a 27 y.o. female presents for new patient visit to establish care.  Introduced to Designer, jewellery role and practice setting.  All questions answered.  Discussed provider/patient relationship and expectations.  She has been moved from [place] 2 years ago and is looking to establish care here.  She has a history of endometrial cancer that was diagnosed 3 years ago.  She was given medications including Megace to treat this and also had a Mirena IUD inserted.  She last saw her GYN little over a year ago.  She has a history of chronic upper back pain that started a few years ago.  She states that she was seeing a chiropractor and she has been doing yoga twice a week.  She states that her breasts increased in size after taking medication for her endometrial cancer and she thinks this may be causing the pain.  She is not taking any over-the-counter medications.  Depression and anxiety screen done:     01/08/2023    1:32 PM 12/26/2021    2:03 PM  Depression screen PHQ 2/9  Decreased Interest 2 0  Down, Depressed, Hopeless 2 0  PHQ - 2 Score 4 0  Altered sleeping 2   Tired, decreased energy 2   Change in appetite 0   Feeling bad or failure about yourself  0   Trouble concentrating 2   Moving slowly or fidgety/restless 0   Suicidal thoughts 0   PHQ-9 Score 10   Difficult doing work/chores Somewhat difficult       01/08/2023    1:33 PM  GAD 7 : Generalized Anxiety Score  Nervous, Anxious, on Edge 2  Control/stop worrying 2  Worry too much - different things 2  Trouble relaxing 0  Restless 0   Easily annoyed or irritable 2  Afraid - awful might happen 2  Total GAD 7 Score 10  Anxiety Difficulty Somewhat difficult    Past Medical History:  Diagnosis Date   Endometrial cancer ([place]) 11/18/2019   Dr. Boyce Medici [facility]   Malignant neoplasm of endometrium [facility]) 11/02/2019   Formatting of this note is different from the original.  Referral Dr. Leane Call.      10/09/19: EMB showed comnplex atypical hyperplasia, suspicious for endometrioid carcinoma. (Sent for outside consult).      10/15/19: EMB showed well differentiated endometrioid adenocarcinoma, FIGO gr 1.      OUTSIDE SLIDE REVIEW     A.  Endometrium, biopsy ([id number], Part A, 10/09/2019):   Microscopic foci     Past Surgical History:  Procedure Laterality Date   DILATION AND CURETTAGE OF UTERUS     x2    Family History  Problem Relation Age of Onset   Colon cancer Mother    Diabetes Father    Diabetes Paternal Grandmother    Ovarian cancer Neg Hx    Breast cancer Neg Hx  Endometrial cancer Neg Hx    Prostate cancer Neg Hx    Pancreatic cancer Neg Hx      Social History   Tobacco Use   Smoking status: Never   Smokeless tobacco: Never  Vaping Use   Vaping Use: Never used  Substance Use Topics   Alcohol use: Yes    Comment: weekends   Drug use: Never    Current Outpatient Medications on File Prior to Visit  Medication Sig Dispense Refill   levonorgestrel (MIRENA) 20 MCG/DAY IUD 1 each by Intrauterine route once.     No current facility-administered medications on file prior to visit.     Review of Systems  Constitutional: Negative.   HENT: Negative.    Eyes:  Positive for visual disturbance.  Respiratory: Negative.    Cardiovascular: Negative.   Gastrointestinal: Negative.   Genitourinary: Negative.   Musculoskeletal:  Positive for arthralgias (knees, comes and goes) and back pain.  Skin: Negative.   Psychiatric/Behavioral:  Positive for dysphoric mood  (seasonally). The patient is not nervous/anxious.       Objective:    BP 104/70   Pulse 93   Temp (!) 97.5 F (36.4 C)   Ht '5\' 2"'$  (1.575 m)   Wt 145 lb 6.4 oz (66 kg)   SpO2 98%   BMI 26.59 kg/m   Wt Readings from Last 3 Encounters:  01/08/23 145 lb 6.4 oz (66 kg)  11/14/21 156 lb (70.8 kg)  06/29/21 150 lb (68 kg)    BP Readings from Last 3 Encounters:  01/08/23 104/70  11/14/21 116/67  06/29/21 115/70    Physical Exam Vitals and nursing note reviewed.  Constitutional:      General: She is not in acute distress.    Appearance: Normal appearance.  HENT:     Head: Normocephalic and atraumatic.     Right Ear: Tympanic membrane, ear canal and external ear normal.     Left Ear: Tympanic membrane, ear canal and external ear normal.  Eyes:     Conjunctiva/sclera: Conjunctivae normal.  Cardiovascular:     Rate and Rhythm: Normal rate and regular rhythm.     Pulses: Normal pulses.     Heart sounds: Normal heart sounds.  Pulmonary:     Effort: Pulmonary effort is normal.     Breath sounds: Normal breath sounds.  Abdominal:     Palpations: Abdomen is soft.     Tenderness: There is no abdominal tenderness.  Musculoskeletal:        General: Normal range of motion.     Cervical back: Normal range of motion and neck supple.     Right lower leg: No edema.     Left lower leg: No edema.  Lymphadenopathy:     Cervical: No cervical adenopathy.  Skin:    General: Skin is warm and dry.  Neurological:     General: No focal deficit present.     Mental Status: She is alert and oriented to person, place, and time.     Cranial Nerves: No cranial nerve deficit.     Coordination: Coordination normal.     Gait: Gait normal.  Psychiatric:        Mood and Affect: Mood normal.        Behavior: Behavior normal.        Thought Content: Thought content normal.        Judgment: Judgment normal.       Assessment & Plan:   Problem List Items Addressed  This Visit       Other    History of endometrial cancer    Encouraged her to schedule an appointment with her GYN in [place] or establish with a provider in [place].  She states that she was taking Megace and has an IUD inserted.  She also was treated with 2 D&Cs.       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  She has been having upper back pain for the past several years.  She states when she started the Megace, her breasts have increased in size.  Will have her start back stretches daily.  She can also take ibuprofen or Tylenol as needed for pain.  Follow-up in 3 months.      Other Visit Diagnoses     Blurry vision       She works on a computer frequently. She is going to schedule with an eye dr. With family history of DM, will check CMP, CBC, and A1c   Relevant Orders   CBC with Differential/Platelet   Comprehensive metabolic panel   Hemoglobin A1c   Screening, lipid       Screen baseline lipid panel today   Relevant Orders   Lipid panel        Follow up plan: Return in about 3 months (around 04/09/2023) for CPE.

## 2023-01-09 NOTE — Addendum Note (Signed)
Addended by: Adora Fridge on: 01/09/2023 09:35 AM   Modules accepted: Orders

## 2023-12-25 ENCOUNTER — Encounter: Payer: Self-pay | Admitting: Gynecologic Oncology

## 2023-12-25 ENCOUNTER — Inpatient Hospital Stay: Payer: BC Managed Care – PPO | Attending: Gynecologic Oncology | Admitting: Gynecologic Oncology

## 2023-12-25 VITALS — BP 115/79 | HR 106 | Temp 99.1°F | Resp 17 | Wt 152.4 lb

## 2023-12-25 DIAGNOSIS — C541 Malignant neoplasm of endometrium: Secondary | ICD-10-CM | POA: Diagnosis not present

## 2023-12-25 DIAGNOSIS — Z975 Presence of (intrauterine) contraceptive device: Secondary | ICD-10-CM | POA: Diagnosis not present

## 2023-12-25 DIAGNOSIS — Z79818 Long term (current) use of other agents affecting estrogen receptors and estrogen levels: Secondary | ICD-10-CM | POA: Diagnosis not present

## 2023-12-25 DIAGNOSIS — N939 Abnormal uterine and vaginal bleeding, unspecified: Secondary | ICD-10-CM

## 2023-12-25 DIAGNOSIS — Z800 Family history of malignant neoplasm of digestive organs: Secondary | ICD-10-CM

## 2023-12-25 NOTE — Progress Notes (Signed)
 Gynecologic Oncology Return Clinic Visit  12/25/23  Reason for Visit: follow-up in the setting of hormone treatment for clinical stage I endometrial cancer   Treatment History: Oncology History Overview Note  IHC MMR intact ER +, PR +, patchy p16 +, HR HPV negative Had genetics consult in [place] , did not undergo testing    Malignant neoplasm of endometrium (HCC) (Resolved)  06/28/2019 Imaging   [facility]: Uterus measures 8 x 4.1 x 2.8 cm with an endometrial lining of 3.6 mm.  Bilateral ovaries with polycystic ovary appearance.   10/09/2019 Initial Biopsy   EMB: CAH, suspicious for [facility]   10/15/2019 Pathology Results   EMB: Grade 1 EMCA   11/02/2019 Initial Diagnosis   Malignant neoplasm of endometrium (HCC)   11/09/2019 Imaging   MRI pelvis: no evidence of myometrial invasion   11/22/2019 Surgery   D&C, Delmar IUD placement Pathology: focal residual EMCA with treatment effect   06/08/2020 Surgery   D&C, Mirena IUD replacement Pathology: scant pieces of benign endometrium with treatment effect   06/29/2021 Pathology Results   EMB: Polypoid fragments of inactive endometrium with hormone effect.  - Benign squamous cells.  - No hyperplasia or malignancy.    The patient's cancer history is outlined below.  Briefly, she presented in 2020 with reports of 4 months of persistent vaginal bleeding.  After a pelvic ultrasound and Pap test were reportedly normal, patient underwent endometrial biopsy that was suspicious for cancer.  In October 2020, she underwent D&C confirming endometrial cancer.  She has been treated with a Mirena IUD since that time, and she was additionally on Megace until she moved approximately 9 months ago.   The patient moved from [place]  to [facility]  9 months ago from work (she works in sales promotion account executive).  She notes having occasional vaginal spotting, which happens every 4 months or less frequently.  This is described as spotting on  the toilet paper when she wipes after voiding.  No bleeding on pad or underwear.  She has had some cramping and pelvic pain with that feels like menstrual cramping that she notices when she sits for a long time.  She endorses a good appetite without nausea or emesis.  She reports normal bowel and bladder function.  The patient last saw me in November 2022.  At that time she was having some pelvic symptoms and we obtained pelvic ultrasound to assess the location of her IUD.  IUD was in expected position in the upper uterine canal.  Endometrium was thin measuring 3 mm.  Bilateral ovaries were normal in appearance.  She was scheduled to see the patient in June 2023, but she did not come to this visit.  The patient was seen by her OB/GYN in early November 2020 for with lower abdominal pain/cramping for 1 week and vaginal spotting.  The patient [facility]23 which showed a uterus measuring 7.3 x 4.2 x 3 cm with an endometrium of 9.7 mm.  Endometrium described thickened with increased blood flow.  Ovaries with polycystic ovary appearance, no adnexal masses.  Small amount of free fluid seen in the cul-de-sac.  Given continued bleeding, endometrial biopsy was recommended and performed on 11/26.  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  Interval History: Patient reports overall doing well.  In November, she began having light daily bleeding.  Denies any heavy bleeding.  Endorses some pelvic pain and cramping.  Endorses normal bowel and  bladder function.  Past Medical/Surgical History: Past Medical History:  Diagnosis Date   Endometrial cancer (HCC) 11/18/2019   Dr. [place]ing [place]  [facility]   Malignant neoplasm of endometrium [facility]) 11/02/2019   Formatting of this note is different from the original.  Referral Dr. Darian Kopp.      10/09/19: EMB showed comnplex atypical hyperplasia, suspicious for  endometrioid carcinoma. (Sent for outside consult).      10/15/19: EMB showed well differentiated endometrioid adenocarcinoma, FIGO gr 1.      OUTSIDE SLIDE REVIEW     A.  Endometrium, biopsy ([id number], Part A, 10/09/2019):   Microscopic foci     Past Surgical History:  Procedure Laterality Date   DILATION AND CURETTAGE OF UTERUS     x2    Family History  Problem Relation Age of Onset   Colon cancer Mother    Diabetes Father    Diabetes Paternal Grandmother    Ovarian cancer Neg Hx    Breast cancer Neg Hx    Endometrial cancer Neg Hx    Prostate cancer Neg Hx    Pancreatic cancer Neg Hx     Social History   Socioeconomic History   Marital status: Single    Spouse name: Not on file   Number of children: Not on file   Years of education: Not on file   Highest education level: Not on file  Occupational History   Not on file  Tobacco Use   Smoking status: Never   Smokeless tobacco: Never  Vaping Use   Vaping status: Never Used  Substance and Sexual Activity   Alcohol use: Yes    Comment: weekends   Drug use: Never   Sexual activity: Yes    Birth control/protection: I.U.D.  Other Topics Concern   Not on file  Social History Narrative   Not on file   Social Drivers of Health   Financial Resource Strain: Not on file  Food Insecurity: Not on file  Transportation Needs: Not on file  Physical Activity: Not on file  Stress: Not on file  Social Connections: Unknown (06/17/2022)   [facility]   Social Network    Social Network: Not on file    Current Medications:  Current Outpatient Medications:    megestrol (MEGACE) 40 MG tablet, Take 1 tablet twice a day by oral route for 30 days., Disp: , Rfl:    levonorgestrel (MIRENA) 20 MCG/DAY IUD, 1 each by Intrauterine route once., Disp: , Rfl:   Review of Systems: + Abdominal pain, pelvic pain, vaginal bleeding. Denies appetite changes, fevers, chills, fatigue, unexplained weight  changes. Denies hearing loss, neck lumps or masses, mouth sores, ringing in ears or voice changes. Denies cough or wheezing.  Denies shortness of breath. Denies chest pain or palpitations. Denies leg swelling. Denies abdominal distention, blood in stools, constipation, diarrhea, nausea, vomiting, or early satiety. Denies pain with intercourse, dysuria, frequency, hematuria or incontinence. Denies hot flashes or vaginal discharge.   Denies joint pain, back pain or muscle pain/cramps. Denies itching, rash, or wounds. Denies dizziness, headaches, numbness or seizures. Denies swollen lymph nodes or glands, denies easy bruising or bleeding. Denies anxiety, depression, confusion, or decreased concentration.  Physical Exam: BP 115/79 (BP Location: Left Arm, Patient Position: Sitting)   Pulse (!) 106   Temp 99.1 F (37.3 C) (Oral)   Resp 17   Wt 152 lb 6.4 oz (69.1 kg)   SpO2 100%   BMI 27.87  kg/m  General: Alert, oriented, no acute distress. HEENT: Normocephalic, atraumatic, sclera anicteric. Chest: Clear to auscultation bilaterally.  No wheezes or rhonchi. Cardiovascular: Regular rate and rhythm, no murmurs. Abdomen: soft, nontender.  Normoactive bowel sounds.  No masses or hepatosplenomegaly appreciated.   Extremities: Grossly normal range of motion.  Warm, well perfused.  No edema bilaterally. Lymphatics: No cervical, supraclavicular, or inguinal adenopathy. GU: Normal appearing external genitalia without erythema, excoriation, or lesions.  Speculum exam reveals well rugated vaginal mucosa, cervix normal in appearance with IUD strings visible.  Bimanual exam reveals mobile uterus, no adnexal masses, no parametrial invasion.    Laboratory & Radiologic Studies: None new  Assessment & Plan: Christina Alvarez is a 28 y.o. 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  Seen again recently at the end of 2024 with new onset vaginal bleeding and pelvic pain.  Endometrial biopsy showed FIGO grade 2 endometrioid adenocarcinoma.  Spent some time reviewing recent biopsy, which shows recurrence of her cancer and progression to moderately differentiated endometrioid adenocarcinoma.  This happened in the setting of hormonal therapy with Mirena IUD.  We discussed treatment options again for endometrial cancer including standard of care which is definitive surgical management, hormonal therapy with progesterone, and radiation therapy.  Although the patient had initially been interested in fertility and uterine preservation, I am concerned about the risk of continued treatment with medical management given recurrence while on progesterone therapy.  We discussed options of adding oral progesterone and consideration of starting metformin.  We discussed surgical management which would include total hysterectomy and bilateral salpingectomy.  As long as the ovaries look normal at the time of surgery, goal would be for ovarian preservation given her age.  Unfortunately, total hysterectomy with change future fertility.  We discussed options of egg harvesting and adoption.  I strongly encouraged consultation with REI prior to surgery.  With the patient's permission, I reached out to Dr. Noemi at [facility] who will help facilitate getting the patient in for an urgent consultation.  Ultimately, after our discussion, the patient is in agreement to move forward with definitive surgery.  My office will work on finding a date for surgery at the end of the month, as patient would like some time to process new treatment plan.  It may be that her surgery happens at [facility] depending on my surgical schedule.  She will return either to the office here preoperative visit closer to the  date of surgery or come to [facility] for a preoperative visit if her surgery is ultimately scheduled there.  At my first visit, we had discussed my concern about her mother's history of colon cancer and the patient's history of endometrial cancer.  She met with a genetic counselor at the time of her diagnosis but ultimately did not pursue genetic testing.  I stressed the importance of this in terms of impact for her current cancer diagnosis but also other cancers that she may be at risk for and genetic testing results would mean for first-degree family members.  Ultimately, she was amenable to referral.  38 minutes of total time was spent for this patient encounter, including preparation, face-to-face counseling with the patient and coordination of care, and documentation of the encounter.  Comer Dollar, MD  Division of Gynecologic Oncology  Department of Obstetrics and  Gynecology  [facility]

## 2023-12-25 NOTE — Patient Instructions (Addendum)
 It was good to see you today.  I know we had previously talked about genetics and you did not have genetic testing done in [place] .  I would strongly recommend another conversation with one of our genetic counselors.  I have placed a referral for you to at least speak with them again about the risks and benefits of genetic testing.  I would recommend calling [facility].  This is the REI practice in the [place] area that we talked about today.  We will tentatively plan for surgery at [facility] with Dr. Comer Dollar on January 13, 2024. We will contact you with additional information. We will also contact you if there are any other openings or cancellations here in [place].

## 2023-12-26 ENCOUNTER — Telehealth: Payer: Self-pay | Admitting: *Deleted

## 2023-12-26 NOTE — Telephone Encounter (Signed)
 Per Dr Pricilla Holm patient scheduled for a CT scan on 1/21 at 3:30 pm. Patient to arrive at 1:30 pm to drink the contrast. Patient aware of the date/time

## 2023-12-29 NOTE — Progress Notes (Signed)
 REFERRING PROVIDER: Viktoria Comer SAUNDERS, MD [address] [place],  [place] [address]   PRIMARY PROVIDER:  Nedra Tinnie LABOR, NP  PRIMARY REASON FOR VISIT:  Encounter Diagnoses  Name Primary?   Malignant neoplasm of endometrium (HCC) Yes   Family history of colon cancer      HISTORY OF PRESENT ILLNESS:   Ms. Malone, a 28 y.o. female, was seen for a [place] cancer genetics consultation at the request of Dr. Viktoria due to a personal history of endometrial cancer.  Ms. Shipton presents to clinic today to discuss the possibility of a hereditary predisposition to cancer, to discuss genetic testing, and to further clarify her future cancer risks, as well as potential cancer risks for family members.   In 2020, at the age of 43, Ms. Scales was diagnosed with endometrioid endometrial cancer (MMR intact by IHC).  [facility] [place]  [facility] and [facility] placement.  In January 2021, she had a genetics consult at [facility] and did not proceed with testing at that time.   CANCER HISTORY:  Oncology History Overview Note  IHC MMR intact ER +, PR +, patchy p16 +, HR HPV negative Had genetics consult in [place] , did not undergo testing    Malignant neoplasm of endometrium (HCC) (Resolved)  06/28/2019 Imaging   [facility]: Uterus measures 8 x 4.1 x 2.8 cm with an endometrial lining of 3.6 mm.  Bilateral ovaries with polycystic ovary appearance.   10/09/2019 Initial Biopsy   EMB: CAH, suspicious for [facility]   10/15/2019 Pathology Results   EMB: Grade 1 EMCA   11/02/2019 Initial Diagnosis   Malignant neoplasm of endometrium (HCC)   11/09/2019 Imaging   MRI pelvis: no evidence of myometrial invasion   11/22/2019 Surgery   D&C, Delmar IUD placement Pathology: focal residual EMCA with treatment effect   06/08/2020 Surgery   D&C, Mirena IUD replacement Pathology: scant pieces of benign endometrium with treatment effect   06/29/2021 Pathology Results   EMB: Polypoid  fragments of inactive endometrium with hormone effect.  - Benign squamous cells.  - No hyperplasia or malignancy.     SCREENING/RISK FACTORS:  Breast imaging within the last year: no Number of breast biopsies: 0. Colonoscopy: no; not examined. Hysterectomy: no.  Ovaries intact: yes.  Menarche was at age 48.  Nulliparous.  Menopausal status: premenopausal.  OCP use for approximately 0 years.  HRT use: 0 years. Dermatology screening: previously.   Past Medical History:  Diagnosis Date   Endometrial cancer (HCC) 11/18/2019   Dr. [facility]  [facility]   Malignant neoplasm of endometrium [facility]) 11/02/2019   Formatting of this note is different from the original.  Referral Dr. Darian Kopp.      10/09/19: EMB showed comnplex atypical hyperplasia, suspicious for endometrioid carcinoma. (Sent for outside consult).      10/15/19: EMB showed well differentiated endometrioid adenocarcinoma, FIGO gr 1.      OUTSIDE SLIDE REVIEW     A.  Endometrium, biopsy ([id number], Part A, 10/09/2019):   Microscopic foci     Past Surgical History:  Procedure Laterality Date   DILATION AND CURETTAGE OF UTERUS     x2    FAMILY HISTORY:  We obtained a detailed, 4-generation family history.  Significant diagnoses are listed below: Family History  Problem Relation Age of Onset   Colon cancer Mother        dx early 36s   Ovarian cancer Neg Hx  Breast cancer Neg Hx    Endometrial cancer Neg Hx    Prostate cancer Neg Hx    Pancreatic cancer Neg Hx      Ms. Stormer is unaware of previous family history of genetic testing for hereditary cancer risks.  There is no reported Ashkenazi Jewish ancestry. There is no known consanguinity.  GENETIC COUNSELING ASSESSMENT: Ms. Cappelli is a 28 y.o. female with a personal and family history which is somewhat suggestive of a hereditary cancer syndrome and predisposition to cancer given her age of diagnosis and her mother's diagnosis of colon  cancer. We, therefore, discussed and recommended the following at today's visit.   DISCUSSION: We discussed that 5 - 10% of cancer is hereditary.  Most cases of hereditary endometrial cancer are associated with mutations in the Lynch syndrome genes.  We discussed that while her mismatch repair IHC was reportedly intact, this does not rule out Lynch syndrome.  There are other genes that can be associated with hereditary endometrial or colon cancer syndromes.  We discussed that testing is beneficial for several reasons including knowing how to follow individuals for their cancer risks and understanding if other family members could be at risk for cancer and allowing them to undergo genetic testing.   We reviewed the characteristics, features and inheritance patterns of hereditary cancer syndromes. We also discussed genetic testing, including the appropriate family members to test, the process of testing, insurance coverage and turn-around-time for results. We discussed the implications of a negative, positive, carrier and/or variant of uncertain significant result. We recommended Ms. Fulmore pursue genetic testing for a panel that includes genes associated with endometrial, colon, and other cancers.  The [facility] CancerNext+RNAinsight Panel includes sequencing, rearrangement analysis, and RNA analysis for the following 39 genes: APC, ATM, BAP1, BARD1, BMPR1A, BRCA1, BRCA2, BRIP1, CDH1, CDKN2A, CHEK2, FH, FLCN, MET, MLH1, MSH2, MSH6, MUTYH, NF1, NTHL1, PALB2, PMS2, PTEN, RAD51C, RAD51D, SMAD4, STK11, TP53, TSC1, TSC2, and VHL (sequencing and deletion/duplication); AXIN2, HOXB13, MBD4, MSH3, POLD1 and POLE (sequencing only); EPCAM and GREM1 (deletion/duplication only).   Based on Ms. Sweitzer personal history of endometrial cancer at age 33, she meets NCCN criteria for genetic testing. Despite that she meets criteria, she may still have an out of pocket cost. We discussed that if her out of pocket cost for testing is over  $100, the laboratory should contact her and discuss the self-pay prices and/or patient pay assistance programs.    PLAN: After considering the risks, benefits, and limitations, Ms. Castello provided informed consent to pursue genetic testing and the blood sample was sent to [facility] for analysis of the CancerNext+RNAinsight Panel. Results should be available within approximately 3 weeks, at which point they will be disclosed by telephone to Ms. Jama, as will any additional recommendations warranted by these results. Ms. Keach will receive a summary of her genetic counseling visit and a copy of her results once available. This information will also be available in Epic.    Ms. Burling questions were answered to her satisfaction today. Our contact information was provided should additional questions or concerns arise. Thank you for the referral and allowing us  to share in the care of your patient.   Zakhari Fogel M. Nydia, MS, [facility] Genetic Counselor [email or web address] (P) [phone]   40 minutes were spent on the date of the encounter in service to the patient including preparation, face-to-face consultation, documentation and care coordination.  The patient was seen alone.  Drs. Iruku, Gudena and/or Lanny were available to discuss this  case as needed.    _______________________________________________________________________ For Office Staff:  Number of people involved in session: 1 Was an Intern/ student involved with case: no

## 2023-12-30 ENCOUNTER — Inpatient Hospital Stay: Payer: BC Managed Care – PPO

## 2023-12-30 ENCOUNTER — Encounter: Payer: Self-pay | Admitting: Genetic Counselor

## 2023-12-30 ENCOUNTER — Inpatient Hospital Stay (HOSPITAL_BASED_OUTPATIENT_CLINIC_OR_DEPARTMENT_OTHER): Payer: BC Managed Care – PPO | Admitting: Genetic Counselor

## 2023-12-30 DIAGNOSIS — C541 Malignant neoplasm of endometrium: Secondary | ICD-10-CM

## 2023-12-30 DIAGNOSIS — Z800 Family history of malignant neoplasm of digestive organs: Secondary | ICD-10-CM

## 2023-12-30 LAB — GENETIC SCREENING ORDER

## 2024-01-01 ENCOUNTER — Encounter: Payer: Self-pay | Admitting: Obstetrics and Gynecology

## 2024-01-05 ENCOUNTER — Telehealth: Payer: Self-pay | Admitting: Surgery

## 2024-01-05 ENCOUNTER — Encounter: Payer: Self-pay | Admitting: Gynecologic Oncology

## 2024-01-05 NOTE — Telephone Encounter (Signed)
Patient called with questions about her upcoming surgery and about her CT scan. Patient surgery scheduled at [facility], gave patient number for [facility] [phone] for more information regarding surgery. Patient also stated she got a message stating her CT scan co-pay was $2700. Advised patient that her scan was authorized by her insurance. Also gave her number for billing department [phone]. Patient expressed thanks for this information and had no other concerns at this time.

## 2024-01-05 NOTE — Telephone Encounter (Addendum)
Called nurse number for Dr Pricilla Holm at [facility] [phone], to attempt to find out more information regarding patient's upcoming surgery. Patient left message stating she was told [facility] didn't have her scheduled yet and she is concerned. Nurse voicemail box full, unable to leave voicemail. Called nurse Lynn's personal cell and left voicemail requesting follow up. Dr Pricilla Holm notified.

## 2024-01-05 NOTE — Telephone Encounter (Signed)
Called patient to relay message from Dr Pricilla Holm confirming that she does have surgery scheduled for 1/28 and that Dr Pricilla Holm has requested a surgical scheduler reach out to her tomorrow, since [facility] is closed today for MLK day. Patient expressed thanks for this information and had no other concerns at this time.

## 2024-01-06 ENCOUNTER — Ambulatory Visit (HOSPITAL_COMMUNITY)
Admission: RE | Admit: 2024-01-06 | Discharge: 2024-01-06 | Disposition: A | Discharge status: Home / Self Care | Payer: BC Managed Care – PPO | Source: Ambulatory Visit | Attending: Gynecologic Oncology | Admitting: Gynecologic Oncology

## 2024-01-06 DIAGNOSIS — C541 Malignant neoplasm of endometrium: Secondary | ICD-10-CM | POA: Diagnosis present

## 2024-01-06 MED ORDER — IOHEXOL 300 MG/ML  SOLN
30.0000 mL | Freq: Once | INTRAMUSCULAR | Status: AC | PRN
  Administered 2024-01-06: 30 mL via ORAL

## 2024-01-06 MED ORDER — IOHEXOL 300 MG/ML  SOLN
100.0000 mL | Freq: Once | INTRAMUSCULAR | Status: AC | PRN
  Administered 2024-01-06: 100 mL via INTRAVENOUS

## 2024-01-12 ENCOUNTER — Other Ambulatory Visit: Payer: Self-pay | Admitting: Gynecologic Oncology

## 2024-01-12 ENCOUNTER — Telehealth: Payer: Self-pay | Admitting: Oncology

## 2024-01-12 DIAGNOSIS — C541 Malignant neoplasm of endometrium: Secondary | ICD-10-CM

## 2024-01-12 NOTE — Progress Notes (Signed)
Called patient to discuss CT findings. Given extensive metastatic disease, I am recommending that we start with neoadjuvant chemotherapy rather than surgery as planned tomorrow. Discussed her case with Dr. Dellia Cloud (REI at [facility]) and Dr. Bertis Ruddy. I have placed an order for port placement and asked Clydie Braun, our nurse navigator, to help facilitate getting this scheduled as quickly as possible.  Plan had been for egg retrieval shortly after recovery. Discussed today ovarian suppression (Lupron) versus consideration of egg retrieval prior to initiation of chemo (would not be considered standard, risk of stimulation of hormonal drive cancer).  I will call the patient tomorrow and discuss further with her. Her mother will also be in town.  Eugene Garnet MD Gynecologic Oncology

## 2024-01-12 NOTE — Telephone Encounter (Signed)
Left a message for Katie at [facility] to add MMR/MSI and P53 testing on accession [phone].

## 2024-01-13 ENCOUNTER — Encounter: Payer: Self-pay | Admitting: Oncology

## 2024-01-13 ENCOUNTER — Encounter: Payer: Self-pay | Admitting: Genetic Counselor

## 2024-01-13 ENCOUNTER — Telehealth: Payer: Self-pay

## 2024-01-13 ENCOUNTER — Telehealth: Payer: Self-pay | Admitting: Gynecologic Oncology

## 2024-01-13 ENCOUNTER — Ambulatory Visit: Payer: Self-pay | Admitting: Genetic Counselor

## 2024-01-13 DIAGNOSIS — Z1379 Encounter for other screening for genetic and chromosomal anomalies: Secondary | ICD-10-CM | POA: Insufficient documentation

## 2024-01-13 DIAGNOSIS — Z800 Family history of malignant neoplasm of digestive organs: Secondary | ICD-10-CM

## 2024-01-13 DIAGNOSIS — C541 Malignant neoplasm of endometrium: Secondary | ICD-10-CM

## 2024-01-13 NOTE — Telephone Encounter (Signed)
Christina Alvarez, Christina Alvarez, called stating she flew in from [place] yesterday. She is requesting for Dr.Tucker to call her soon to discuss treatment options for [facility] since surgery was cancelled at [facility]. (She did not get into specific questions)   Christina Alvarez is aware Dr.Tucker is not in the office today. I will send message to Dr.Tucker and will wait for a response. Christina Alvarez voiced an understanding.

## 2024-01-13 NOTE — Progress Notes (Signed)
HPI:   Christina Alvarez was previously seen in the [facility] clinic due to a personal history of endometrial cancer and concerns regarding a hereditary predisposition to cancer.    Ms. Wellen recent genetic test results were disclosed to her by telephone. These results and recommendations are discussed in more detail below.  CANCER HISTORY:  Oncology History Overview Note  IHC MMR intact ER +, PR +, patchy p16 +, HR HPV negative Had genetics consult in [place], did not undergo testing    Endometrial ca (HCC) (Resolved)  06/29/2021 Initial Diagnosis   Endometrial ca (HCC) (Resolved)   01/12/2024 Genetic Testing   Negative [facility] CancerNext+RNAinsight Panel.  Report date is 01/12/2024.   The [facility] CancerNext+RNAinsight Panel includes sequencing, rearrangement analysis, and RNA analysis for the following 39 genes: APC, ATM, BAP1, BARD1, BMPR1A, BRCA1, BRCA2, BRIP1, CDH1, CDKN2A, CHEK2, FH, FLCN, MET, MLH1, MSH2, MSH6, MUTYH, NF1, NTHL1, PALB2, PMS2, PTEN, RAD51C, RAD51D, SMAD4, STK11, TP53, TSC1, TSC2, and VHL (sequencing and deletion/duplication); AXIN2, HOXB13, MBD4, MSH3, POLD1 and POLE (sequencing only); EPCAM and GREM1 (deletion/duplication only).    Malignant neoplasm of endometrium (HCC) (Resolved)  06/28/2019 Imaging   [facility]: Uterus measures 8 x 4.1 x 2.8 cm with an endometrial lining of 3.6 mm.  Bilateral ovaries with polycystic ovary appearance.   10/09/2019 Initial Biopsy   EMB: CAH, suspicious for [facility]   10/15/2019 Pathology Results   EMB: Grade 1 EMCA   11/02/2019 Initial Diagnosis   Malignant neoplasm of endometrium (HCC)   11/09/2019 Imaging   MRI pelvis: no evidence of myometrial invasion   11/22/2019 Surgery   D&C, Kaylyn Layer IUD placement Pathology: focal residual EMCA with treatment effect   06/08/2020 Surgery   D&C, Mirena IUD replacement Pathology: scant pieces of benign endometrium with treatment effect   06/29/2021 Pathology  Results   EMB: Polypoid fragments of inactive endometrium with hormone effect.  - Benign squamous cells.  - No hyperplasia or malignancy.   01/12/2024 Genetic Testing   Negative [facility] CancerNext+RNAinsight Panel.  Report date is 01/12/2024.   The [facility] CancerNext+RNAinsight Panel includes sequencing, rearrangement analysis, and RNA analysis for the following 39 genes: APC, ATM, BAP1, BARD1, BMPR1A, BRCA1, BRCA2, BRIP1, CDH1, CDKN2A, CHEK2, FH, FLCN, MET, MLH1, MSH2, MSH6, MUTYH, NF1, NTHL1, PALB2, PMS2, PTEN, RAD51C, RAD51D, SMAD4, STK11, TP53, TSC1, TSC2, and VHL (sequencing and deletion/duplication); AXIN2, HOXB13, MBD4, MSH3, POLD1 and POLE (sequencing only); EPCAM and GREM1 (deletion/duplication only).        FAMILY HISTORY:  We obtained a detailed, 4-generation family history.  Significant diagnoses are listed below:      Family History  Problem Relation Age of Onset   Colon cancer Mother          dx early 51s   Ovarian cancer Neg Hx     Breast cancer Neg Hx     Endometrial cancer Neg Hx     Prostate cancer Neg Hx     Pancreatic cancer Neg Hx         Ms. Mccampbell is unaware of previous family history of genetic testing for hereditary cancer risks.  There is no reported Ashkenazi Jewish ancestry. There is no known consanguinity.    GENETIC TEST RESULTS:  The [facility] CancerNext+RNAinsight Panel found no pathogenic mutations.   The [facility] CancerNext+RNAinsight Panel includes sequencing, rearrangement analysis, and RNA analysis for the following 39 genes: APC, ATM, BAP1, BARD1, BMPR1A, BRCA1, BRCA2, BRIP1, CDH1, CDKN2A, CHEK2, FH, FLCN, MET, MLH1, MSH2,  MSH6, MUTYH, NF1, NTHL1, PALB2, PMS2, PTEN, RAD51C, RAD51D, SMAD4, STK11, TP53, TSC1, TSC2, and VHL (sequencing and deletion/duplication); AXIN2, HOXB13, MBD4, MSH3, POLD1 and POLE (sequencing only); EPCAM and GREM1 (deletion/duplication only).   The test report has been scanned into EPIC and is located under the Molecular Pathology  section of the Results Review tab.  A portion of the result report is included below for reference. Genetic testing reported out on January 12, 2024.      Even though a pathogenic variant was not identified, possible explanations for the cancer in the family may include: There may be no hereditary risk for cancer in the family. The cancers in Ms. Steier and/or her family may be sporadic/familial or due to other genetic and environmental factors.  Most cancer is not hereditary.  There may be a gene mutation in one of these genes that current testing methods cannot detect but that chance is small. There could be another gene that has not yet been discovered, or that we have not yet tested, that is responsible for the cancer diagnoses in the family.  It is also possible there is a hereditary cause for the cancer in the family that Ms. Libman did not inherit.   Therefore, it is important to remain in touch with cancer genetics in the future so that we can continue to offer Ms. Bickham the most up to date genetic testing.    ADDITIONAL GENETIC TESTING:   Ms. Spillane genetic testing was fairly extensive.  If there are additional relevant genes identified to increase cancer risk that can be analyzed in the future, we would be happy to discuss and coordinate this testing at that time.     CANCER SCREENING RECOMMENDATIONS:  Ms. Ringley test result is considered negative (normal).  This means that we have not identified a hereditary cause for her personal history of endometrial cancer at this time.   An individual's cancer risk and medical management are not determined by genetic test results alone. Overall cancer risk assessment incorporates additional factors, including personal medical history, family history, and any available genetic information that may result in a personalized plan for cancer prevention and surveillance. Therefore, it is recommended she continue to follow the cancer management and screening  guidelines provided by her oncology and primary healthcare provider.  Based on the reported personal and family history, specific cancer screenings for Ms. Andriana Casa include: Colonoscopies at least every 5 years beginning at age 89, given her mother's history of colon cancer   RECOMMENDATIONS FOR FAMILY MEMBERS:   Individuals in this family might be at some increased risk of developing cancer, over the general population risk, due to the family history of cancer.  Individuals in the family should notify their providers of the family history of cancer.  First degree relatives of those with colon cancer should receive colonoscopies beginning at age 8, or 10 years prior to the earliest diagnosis of colon cancer in the family, and receive colonoscopies at least every 5 years, or as recommended by their gastroenterologist.    FOLLOW-UP:  Cancer genetics is a rapidly advancing field and it is possible that new genetic tests will be appropriate for her and/or her family members in the future. We encourage Ms. Ebanks to remain in contact with cancer genetics, so we can update her personal and family histories and let her know of advances in cancer genetics that may benefit this family.   Our contact number was provided.  They are welcome to call  us at anytime with additional questions or concerns.   Kjirsten Bloodgood M. Rennie Plowman, MS, [facility] Genetic Counselor [email or web address] (P) [phone]

## 2024-01-13 NOTE — Progress Notes (Signed)
Faxed referral to Adelina Mings, Print production planner at [employer] in [place] per Dr. Pricilla Holm.  Also left her a message - office number is [phone].

## 2024-01-13 NOTE — Progress Notes (Signed)
Called Christina Alvarez back and let her know that Kem will not need the appointment next week and thanked her for her help.

## 2024-01-13 NOTE — Progress Notes (Signed)
Christina Alvarez called and they have received the referral.  She mentioned that Dr. Elsie Ra does not see patients at their clinic but 2 of his partners do see patients there.  The soonest she would be able to get [facility] in is next Wednesday.  She said to call her back at [phone] if [facility] decides to schedule.

## 2024-01-13 NOTE — Telephone Encounter (Signed)
Spoke with detail with Ms. Christina Alvarez (patient's mom) about diagnosis, recent scan, recommendation to start with NACT, change to discussion about future fertility. She asked many excellent questions. We also discussed her recent colon cancer diagnosis and treatment (she has not had genetic testing).  Ms. Christina Alvarez is worried that Christina Alvarez will not have the support she needs during treatment here in [place] and is hoping that she will come back at least temporarily to [place] for treatment.   After our call, I reached out to Dr. Melynda Keller, her gyn oncologist in [place] prior to moving to [facility]. Discussed patient's course with him since moving and dilemma about treatment here vs. [place]. He has graciously offered to help her get scheduled in [place] quickly if decision is that [name] will return there for treatment.   I spoke with Sadhana this afternoon and she confirms she is planning to drive back to [place] this week with her mother. They will plan to arrive by end of day on 1/30. I updated Dr. Elsie Ra with this information.  Eugene Garnet MD Gynecologic Oncology

## 2024-01-16 ENCOUNTER — Telehealth: Payer: Self-pay | Admitting: *Deleted

## 2024-01-16 NOTE — Telephone Encounter (Signed)
[facility] called and requested patient's pathology report. Report faxed to [phone]

## 2024-01-27 ENCOUNTER — Other Ambulatory Visit (HOSPITAL_COMMUNITY): Payer: BC Managed Care – PPO

## 2024-01-27 ENCOUNTER — Ambulatory Visit (HOSPITAL_COMMUNITY): Payer: BC Managed Care – PPO

## 2024-02-13 ENCOUNTER — Encounter: Payer: Self-pay | Admitting: Obstetrics and Gynecology

## 2024-03-15 ENCOUNTER — Encounter: Payer: Self-pay | Admitting: Nurse Practitioner

## 2024-03-25 ENCOUNTER — Encounter: Payer: BC Managed Care – PPO | Admitting: Genetic Counselor

## 2024-03-25 ENCOUNTER — Other Ambulatory Visit: Payer: BC Managed Care – PPO

## 2024-06-22 HISTORY — PX: ABDOMINAL HYSTERECTOMY: SHX81

## 2024-07-30 NOTE — Progress Notes (Signed)
 Kyliegh J Mazurkiewicz was seen in Gyn/Onc chemo clinic. Labs, allergies, medications, and toxicity screen reviewed. Clear for chemotherapy. Patient/family needs and learning style identified, RN verbally educated patient about today's plan of care, lab results, and clinic routine. Allyce J Harbour stated understanding.  Patient with request to speak to Augustin Ang, MD regarding plan of care and potential transfer of care back to [place] . Patient is having some difficulty with her insurance - although she current resides in [place] , because her insurance is in [place], they will only ship the lenvatinib  to an [place] address; they will also only send a two-week supply for the first 3 months. Ang, MD at chairside to discuss with patient - team to send records to patient's oncologist in [place] .  Nilmarie Ayala-Fontanez, [facility] notified that patient had TAH-BSO on 06/22/2024 and urine hCG tests are no longer needed; RPH removed parameter from plan. RPH also at chairside to counsel patient on lenvatinib  - ECG completed on 06/22/2024 and QTc level appropriate for lenvatinib  therapy, so repeat ECG not needed at this time. Patient states understanding of plan of care.  Rexene Nap, APRN-CNP notified that patient c/o some burning and pain with urination; UA&C ordered. Patient states understanding to call clinic this weekend for further guidance regarding results.  Patient received D1 C1 Pembrolizumab infusion today. Patient tolerated well. Reviewed AVS with patient. All questions answered.

## 2024-07-30 NOTE — Progress Notes (Signed)
 UA suspicious for UTI, script sent/received at local pharmacy for Bactrim BID x 5 days. My chart sent to [facility] to let her know to pick up the script.

## 2024-07-30 NOTE — Progress Notes (Signed)
 UA and urine culture ordered

## 2024-08-02 NOTE — Progress Notes (Signed)
 Compazine  sent/receeeived at local pharmacy.

## 2024-08-03 ENCOUNTER — Telehealth: Payer: Self-pay | Admitting: *Deleted

## 2024-08-03 ENCOUNTER — Ambulatory Visit: Admitting: Gynecologic Oncology

## 2024-08-03 NOTE — Telephone Encounter (Addendum)
 Spoke with [name] who called the office today and states she spoke with someone from our office on Friday? and was waiting to hear back? Pt states her oncologist from [place]  spoke with Dr. Viktoria as well on Friday and she is transferring care back to [name] . Advised patient her message will be sent to Dr. Viktoria for follow up and the office would call back. Pt thanked the office.

## 2024-08-04 NOTE — Telephone Encounter (Signed)
 If you can quickly get her in on Friday at 2 pm before that is taken by heme scheduler

## 2024-08-04 NOTE — Telephone Encounter (Addendum)
 Christina Alvarez called back and she would like to schedule the appointment with Dr. Lonn on Friday, 08/06/2024 at 2:00.  Advised her to arrive at the cancer center at 1:30 to check in.  Also scheduled her with Dr. Viktoria on 08/13/2024 at 8:00.

## 2024-08-04 NOTE — Telephone Encounter (Signed)
 Called Christina Alvarez and she will be back in [place] on Saturday.  Asked if she can possibly come back for an appointment with Dr. Lonn on Friday, 08/06/24 at 2:00.  She is going to ask her friend that she is traveling with and will let me know by the end of the day today.  Also discussed her last treatment date.  She received pebrolizumab on 07/30/2024.

## 2024-08-06 ENCOUNTER — Encounter: Payer: Self-pay | Admitting: Hematology and Oncology

## 2024-08-06 ENCOUNTER — Inpatient Hospital Stay: Attending: Hematology and Oncology | Admitting: Hematology and Oncology

## 2024-08-06 ENCOUNTER — Other Ambulatory Visit: Payer: Self-pay

## 2024-08-06 VITALS — BP 123/86 | HR 106 | Temp 98.5°F | Resp 18 | Ht 62.0 in | Wt 129.4 lb

## 2024-08-06 DIAGNOSIS — Z170 Estrogen receptor positive status [ER+]: Secondary | ICD-10-CM | POA: Insufficient documentation

## 2024-08-06 DIAGNOSIS — Z9221 Personal history of antineoplastic chemotherapy: Secondary | ICD-10-CM | POA: Insufficient documentation

## 2024-08-06 DIAGNOSIS — C7889 Secondary malignant neoplasm of other digestive organs: Secondary | ICD-10-CM | POA: Insufficient documentation

## 2024-08-06 DIAGNOSIS — C7911 Secondary malignant neoplasm of bladder: Secondary | ICD-10-CM | POA: Insufficient documentation

## 2024-08-06 DIAGNOSIS — C787 Secondary malignant neoplasm of liver and intrahepatic bile duct: Secondary | ICD-10-CM | POA: Insufficient documentation

## 2024-08-06 DIAGNOSIS — C541 Malignant neoplasm of endometrium: Secondary | ICD-10-CM | POA: Diagnosis not present

## 2024-08-06 DIAGNOSIS — C786 Secondary malignant neoplasm of retroperitoneum and peritoneum: Secondary | ICD-10-CM | POA: Diagnosis not present

## 2024-08-06 DIAGNOSIS — C549 Malignant neoplasm of corpus uteri, unspecified: Secondary | ICD-10-CM

## 2024-08-06 DIAGNOSIS — Z800 Family history of malignant neoplasm of digestive organs: Secondary | ICD-10-CM | POA: Insufficient documentation

## 2024-08-06 MED ORDER — LIDOCAINE-PRILOCAINE 2.5-2.5 % EX CREA: TOPICAL_CREAM | CUTANEOUS | 3 refills | Status: DC

## 2024-08-06 MED ORDER — PROCHLORPERAZINE MALEATE 10 MG PO TABS: 10.0000 mg | ORAL_TABLET | Freq: Four times a day (QID) | ORAL | 3 refills | Status: AC | PRN

## 2024-08-06 MED ORDER — LENVATINIB (20 MG DAILY DOSE) 2 X 10 MG PO CPPK: 20.0000 mg | ORAL_CAPSULE | Freq: Every day | ORAL | 11 refills | Status: DC

## 2024-08-06 NOTE — Assessment & Plan Note (Signed)
 The patient was originally diagnosed with uterine cancer in 2020 and subsequently lost to follow-up She was subsequently evaluated and treated in [place] , records were reviewed and summarized in the oncologic history.  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 p53 stain shows mutant pattern staining.  MSI stable, MMR intact, foundation One: Low tumor mutation burden, HRD negative.  Genetic testing from January 2025 was negative  She received first cycle of combination of pembrolizumab and lenvatinib  on July 30, 2024 which she tolerated well We will continue her current treatment for now I plan to order repeat staging CT imaging next week and I will see her after Labor Day for review of results, chemo education class, lab and treatment on September 5 for cycle 2 We will follow on imaging study roughly every 3 months I will get baseline EKG today Due to poor venous access, I recommend port placement for future treatment and she agreed

## 2024-08-06 NOTE — Progress Notes (Signed)
 [facility] CONSULT NOTE  Patient Care Team: Nedra Tinnie LABOR, NP as PCP - General (Internal Medicine) Lonn Hicks, MD as Consulting Physician (Hematology and Oncology)  ASSESSMENT & PLAN:  Uterine cancer [facility]) The patient was originally diagnosed with uterine cancer in 2020 and subsequently lost to follow-up She was subsequently evaluated and treated in [place] , records were reviewed and summarized in the oncologic history.  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 p53 stain shows mutant pattern staining.  MSI stable, MMR intact, foundation One: Low tumor mutation burden, HRD negative.  Genetic testing from January 2025 was negative  She received first cycle of combination of pembrolizumab and lenvatinib  on July 30, 2024 which she tolerated well We will continue her current treatment for now I plan to order repeat staging CT imaging next week and I will see her after Labor Day for review of results, chemo education class, lab and treatment on September 5 for cycle 2 We will follow on imaging study roughly every 3 months I will get baseline EKG today Due to poor venous access, I recommend port placement for future treatment and she agreed  Orders Placed This Encounter  Procedures   CT CHEST ABDOMEN PELVIS W CONTRAST    Standing Status:   Future    Expected Date:   08/13/2024    Expiration Date:   08/06/2025    If indicated for the ordered procedure, I authorize the administration of contrast media per Radiology protocol:   Yes    Does the patient have a contrast media/X-ray dye  allergy?:   No    Is patient pregnant?:   No    Preferred imaging location?:   [facility]    If indicated for the ordered procedure, I authorize the administration of oral contrast media per Radiology protocol:   No    Reason for no oral contrast::   no need oral contrast   IR IMAGING GUIDED PORT INSERTION    Standing Status:   Future    Expected Date:   08/13/2024    Expiration Date:   08/06/2025    Reason for Exam (SYMPTOM  OR DIAGNOSIS REQUIRED):   need port 9/5    Preferred Imaging Location?:   [facility]   CBC with Differential (Cancer Center Only)    Standing Status:   Future    Expected Date:   08/20/2024    Expiration Date:   08/20/2025   CMP (Cancer Center only)    Standing Status:   Future    Expected Date:   08/20/2024    Expiration Date:   08/20/2025   CBC with Differential (Cancer Center Only)    Standing Status:   Future    Expected Date:   09/10/2024    Expiration Date:   09/10/2025   CMP (Cancer Center only)    Standing Status:   Future    Expected Date:   09/10/2024    Expiration Date:   09/10/2025   T4    Standing Status:   Future    Expected Date:   09/10/2024    Expiration Date:   09/10/2025   TSH    Standing  Status:   Future    Expected Date:   09/10/2024    Expiration Date:   09/10/2025   CBC with Differential (Cancer Center Only)    Standing Status:   Future    Expected Date:   10/01/2024    Expiration Date:   10/01/2025   CMP (Cancer Center only)    Standing Status:   Future    Expected Date:   10/01/2024    Expiration Date:   10/01/2025   CBC with Differential (Cancer Center Only)    Standing Status:   Future    Expected Date:   10/22/2024    Expiration Date:   10/22/2025   CMP (Cancer Center only)    Standing Status:   Future    Expected Date:   10/22/2024    Expiration Date:   10/22/2025   CBC with Differential (Cancer Center Only)    Standing Status:   Future    Expected Date:   11/12/2024    Expiration Date:   11/12/2025   CMP (Cancer  Center only)    Standing Status:   Future    Expected Date:   11/12/2024    Expiration Date:   11/12/2025   T4    Standing Status:   Future    Expected Date:   11/12/2024    Expiration Date:   11/12/2025   TSH    Standing Status:   Future    Expected Date:   11/12/2024    Expiration Date:   11/12/2025   CBC with Differential (Cancer Center Only)    Standing Status:   Future    Expected Date:   12/03/2024    Expiration Date:   12/03/2025   CMP (Cancer Center only)    Standing Status:   Future    Expected Date:   12/03/2024    Expiration Date:   12/03/2025   CBC with Differential (Cancer Center Only)    Standing Status:   Future    Expected Date:   12/24/2024    Expiration Date:   12/24/2025   CMP (Cancer Center only)    Standing Status:   Future    Expected Date:   12/24/2024    Expiration Date:   12/24/2025   CBC with Differential (Cancer Center Only)    Standing Status:   Future    Expected Date:   01/14/2025    Expiration Date:   01/14/2026   CMP (Cancer Center only)    Standing Status:   Future    Expected Date:   01/14/2025    Expiration Date:   01/14/2026   T4    Standing Status:   Future    Expected Date:   01/14/2025    Expiration Date:   01/14/2026   TSH    Standing Status:   Future    Expected Date:   01/14/2025    Expiration Date:   01/14/2026   CBC with Differential (Cancer Center Only)    Standing Status:   Future    Expected Date:   02/04/2025    Expiration Date:   02/04/2026   CMP (Cancer Center only)    Standing Status:   Future    Expected Date:   02/04/2025    Expiration Date:   02/04/2026   CBC with Differential (Cancer Center Only)    Standing Status:   Future    Expected Date:   02/25/2025    Expiration Date:   02/25/2026   CMP (Cancer Center  only)    Standing Status:   Future    Expected Date:   02/25/2025    Expiration Date:   02/25/2026   CBC with Differential (Cancer Center Only)    Standing Status:   Future    Expected Date:   03/18/2025    Expiration  Date:   03/18/2026   CMP (Cancer Center only)    Standing Status:   Future    Expected Date:   03/18/2025    Expiration Date:   03/18/2026   T4    Standing Status:   Future    Expected Date:   03/18/2025    Expiration Date:   03/18/2026   TSH    Standing Status:   Future    Expected Date:   03/18/2025    Expiration Date:   03/18/2026   CBC with Differential (Cancer Center Only)    Standing Status:   Future    Expected Date:   04/08/2025    Expiration Date:   04/08/2026   CMP (Cancer Center only)    Standing Status:   Future    Expected Date:   04/08/2025    Expiration Date:   04/08/2026   CBC with Differential (Cancer Center Only)    Standing Status:   Future    Expected Date:   04/29/2025    Expiration Date:   04/29/2026   CMP (Cancer Center only)    Standing Status:   Future    Expected Date:   04/29/2025    Expiration Date:   04/29/2026   CBC with Differential (Cancer Center Only)    Standing Status:   Future    Expected Date:   05/20/2025    Expiration Date:   05/20/2026   CMP (Cancer Center only)    Standing Status:   Future    Expected Date:   05/20/2025    Expiration Date:   05/20/2026   T4    Standing Status:   Future    Expected Date:   05/20/2025    Expiration Date:   05/20/2026   TSH    Standing Status:   Future    Expected Date:   05/20/2025    Expiration Date:   05/20/2026   CBC with Differential (Cancer Center Only)    Standing Status:   Future    Expected Date:   06/10/2025    Expiration Date:   06/10/2026   CMP (Cancer Center only)    Standing Status:   Future    Expected Date:   06/10/2025    Expiration Date:   06/10/2026   CBC with Differential (Cancer Center Only)    Standing Status:   Future    Expected Date:   07/01/2025    Expiration Date:   07/01/2026   CMP (Cancer Center only)    Standing Status:   Future    Expected Date:   07/01/2025    Expiration Date:   07/01/2026   CBC with Differential (Cancer Center Only)    Standing Status:   Future    Expected Date:   07/22/2025     Expiration Date:   07/22/2026   CMP (Cancer Center only)    Standing Status:   Future    Expected Date:   07/22/2025    Expiration Date:   07/22/2026   T4    Standing Status:   Future    Expected Date:   07/22/2025    Expiration Date:   07/22/2026   TSH  Standing Status:   Future    Expected Date:   07/22/2025    Expiration Date:   07/22/2026   CMP (Cancer Center only)    Standing Status:   Future    Expected Date:   08/17/2024    Expiration Date:   08/06/2025   CBC with Differential (Cancer Center Only)    Standing Status:   Future    Expected Date:   08/17/2024    Expiration Date:   08/06/2025   TSH    Standing Status:   Future    Expected Date:   08/17/2024    Expiration Date:   08/06/2025   EKG 12-Lead    Ordered by an unspecified provider     The total time spent in the appointment was 80 minutes encounter with patients including review of chart and various tests results, discussions about plan of care and coordination of care plan   All questions were answered. The patient knows to call the clinic with any problems, questions or concerns. No barriers to learning was detected.  Almarie Bedford, MD 8/22/20252:50 PM  CHIEF COMPLAINTS/PURPOSE OF CONSULTATION:  Urine cancer, for further management  HISTORY OF PRESENTING ILLNESS:  Christina Alvarez 28 y.o. female is here because of diagnosis of uterine cancer I have reviewed 400 pages of outside records and collaborated history with the patient She has strong family history of cancer She was diagnosed with uterine cancer in 2020, subsequently lost to follow-up She was found to have significant disease burden early this year.  Her family lives in [place]  and she received most of her care in [place]  up until recently She moved back to [place]  recently She received first dose of treatment a week ago  I have reviewed her chart and materials related to her cancer extensively and collaborated history with the patient. Summary of oncologic history is as  follows: Oncology History Overview Note  Original path: adenocarcinoma, IHC MMR intact ER +, PR +, patchy p16 +, HR HPV negative  Negative [employer] CancerNext+RNAinsight Panel.  Report date is 01/12/2024.   The [employer] CancerNext+RNAinsight Panel includes sequencing, rearrangement analysis, and RNA analysis for the following 39 genes: APC, ATM, BAP1, BARD1, BMPR1A, BRCA1, BRCA2, BRIP1, CDH1, CDKN2A, CHEK2, FH, FLCN, MET, MLH1, MSH2, MSH6, MUTYH, NF1, NTHL1, PALB2, PMS2, PTEN, RAD51C, RAD51D, SMAD4, STK11, TP53, TSC1, TSC2, and VHL (sequencing and deletion/duplication); AXIN2, HOXB13, MBD4, MSH3, POLD1 and POLE (sequencing only); EPCAM and GREM1 (deletion/duplication only).   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  Bilateral ovaries with polycystic ovary appearance.   10/09/2019 Initial Biopsy   EMB: CAH, suspicious for [facility]   10/15/2019 Pathology Results   EMB: Grade 1 EMCA   11/02/2019 Initial Diagnosis   Malignant neoplasm of endometrium (HCC)   11/09/2019 Imaging   MRI pelvis: no evidence of myometrial invasion   11/22/2019 Surgery   D&C, Delmar IUD placement Pathology: focal residual EMCA with treatment effect   06/08/2020 Surgery   D&C, Mirena IUD replacement Pathology: scant pieces of benign endometrium with treatment effect   06/29/2021 Pathology Results   EMB: Polypoid fragments of inactive endometrium with hormone effect.  - Benign squamous cells.  - No hyperplasia  or malignancy.   01/12/2024 Genetic Testing   Negative [employer] CancerNext+RNAinsight Panel.  Report date is 01/12/2024.   The [employer] CancerNext+RNAinsight Panel includes sequencing,  rearrangement analysis, and RNA analysis for the following 39 genes: APC, ATM, BAP1, BARD1, BMPR1A, BRCA1, BRCA2, BRIP1, CDH1, CDKN2A, CHEK2, FH, FLCN, MET, MLH1, MSH2, MSH6, MUTYH, NF1, NTHL1, PALB2, PMS2, PTEN, RAD51C, RAD51D, SMAD4, STK11, TP53, TSC1, TSC2, and VHL (sequencing and deletion/duplication); AXIN2, HOXB13, MBD4, MSH3, POLD1 and POLE (sequencing only); EPCAM and GREM1 (deletion/duplication only).    01/16/2024 - 05/07/2024 Chemotherapy   She received 7 cycles of neoadjuvant chemotherapy with carboplatin, paclitaxel and dostarlimab   01/28/2024 Imaging   1.  No evidence of new primary or metastatic neoplasia in the chest   2.  Soft tissue scalloping along the liver border incompletely imaged in the  upper abdomen, consider follow-up abdomen/pelvis CT to further interrogate if  deemed clinically appropriate to assess for evidence of peritoneal tumor  implants    03/21/2024 Imaging   1.  Peritoneal disease in the abdomen and pelvis as described with multiple  discrete and confluent peritoneal deposits in the perihepatic region and in  the pelvis as described  2.  Masslike density in the right adnexa not seen separately from the right  ovary could represent a primary malignancy or a metastatic deposit. Discrete  and confluent peritoneal deposits in the pelvis and superior to the bladder  with trace pelvic free fluid  3.  Slightly prominent noticeable mesenteric and retroperitoneal nodes with  prominent to enlarged periportal lymph nodes which are indeterminate and could be metastatic. Continued follow-up is recommended.  4.  Hypodensity in the inferior right hepatic lobe is indeterminate but  concerning for a metastatic lesion  5.  Heterogeneous uterus with intrauterine device in place. Prominent adnexal  vascularity  6. No aggressive osseous lesions    03/21/2024 Imaging   1.  Slight interval increase in size of small cardiophrenic and prevascular  lymph node, which are  indeterminate. Attention on follow-up exam is  recommended.  2. No suspicious pulmonary nodules.    03/30/2024 Pathology Results   Pathologic Diagnosis   A. Perihepatic lesion, biopsy: Metastatic carcinoma, consistent with a mullerian origin   Comment: The patient has a known history of endometrioid adenocarcinoma, FIGO grade 1 ([id number]). Immunohistochemical staining of block A2 reveals tumor cells positive for CK7, SOX17, and ER, and negative for CK20 and PAX8. 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     03/30/2024 Pathology Results   PROCEDURE PERFORMED:  Ultrasound guided needle biopsy.   FINDINGS/TARGET:  Perihepatic liver lesion    06/01/2024 Imaging   1.  Worsening peritoneal metastasis with increase in size and number of  confluent peritoneal deposits in the perihepatic region and pelvis. Trace free  pelvic fluid.  2.  Increase in size of masslike density in the right adnexa which is not seen  separately from the right ovary likely relates to metastatic involvement.  3.  Unchanged slightly prominent mesenteric and retroperitoneal lymph nodes  with enlarged periportal lymph nodes are indeterminate. Continued follow-up is  recommended  4. Indeterminate hypodensity in the inferior right hepatic lobe is also  stable in size.  5.  Heterogeneous uterus with intrauterine device in place. Prominent adnexal  vascularity.  6. No aggressive osseous lesions..     06/22/2024 Surgery   Date of Surgery: 06/22/2024   Pre-operative Diagnosis:  Endometrial cancer [C54.1]  Post-operative Diagnosis:  Endometrial cancer [C54.1]  Procedure:  Partial right liver (segments 5-8) resection Partial right liver (segment 6) resection Partial left liver (segment 2) resection Partial left liver (segment 1) resection Right liver wedge excision (segment 6) Partial right diaphragm resection Partial  splenectomy Intraoperative liver ultrasound Right thoracostomy chest tube placement Resection of porta hepatis tumors <8cm Cholecystectomy  Surgeon:  Dr [place] Cloyd   Assistant:  Dr Alfonso Galeazzi Dr Therisa Sanders Morbey  Clinical Note:  VYCTORIA DICKMAN is a 28 y.o. female with a history of endometrial cancer with peritoneal metastases. She received systemic chemotherapy and presents today for cytoreductive surgery with Dr Lansing.   Findings: Extensive perihepatic disease which required complete mobilization of the liver, partial right diaphragm resection, and partial hepatectomy of superficial liver of at least 50% of the liver surface. All visualized disease resected.    06/22/2024 Pathology Results   A. Omentum, omentectomy: Metastatic adenocarcinoma, consistent with a mullerian origin  B. Perigastric nodule, excision: Metastatic adenocarcinoma, consistent with a mullerian origin  C. Peritoneal nodule, excision: Metastatic adenocarcinoma, consistent with a mullerian origin  D. Appendix, appendectomy: Metastatic adenocarcinoma, consistent with a mullerian origin  E. Pelvic nodule, excision: Metastatic adenocarcinoma, consistent with a mullerian origin  F. Right tube and ovary, salpingo-oophorectomy: Ovary and fallopian tube with deposits of metastatic adenocarcinoma, consistent with a mullerian origin Lymphovascular invasion identified  G. 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 Bladder peritoneum,  biopsy: Metastatic adenocarcinoma, consistent with a mullerian origin  I. Perirectal nodule, excision: Metastatic adenocarcinoma, consistent with a mullerian origin  J. Sigmoid mesentery mass, excision: Metastatic adenocarcinoma, consistent with a mullerian origin  K. Posterior cul-de-sac, biopsy: Metastatic adenocarcinoma, consistent with a mullerian origin  L. Right aortic lymph nodes, lymphadenectomy: 3 lymph nodes, negative for carcinoma   M. Anterior peritoneal nodule, excision: Metastatic adenocarcinoma, consistent with a mullerian origin  N. Round ligament nodule, excision: Metastatic adenocarcinoma, consistent with a mullerian origin  O. Caudate process tumors, excision: Metastatic adenocarcinoma, consistent with a mullerian origin  P. Gallbladder, cholecystectomy: Gallbladder with no significant pathologic change, negative for carcinoma  Q. Cystic lymph node, lymphadenectomy: 1 lymph node, negative for carcinoma   R. Caudate implant tumor, excision: Metastatic adenocarcinoma, consistent with a mullerian origin  S. Porta hepatis tumors, excision: Metastatic adenocarcinoma, consistent with a mullerian origin  T. Fissure tumor, excision: Metastatic adenocarcinoma, consistent with a mullerian origin  U. Liver, segment 2, partial hepatectomy: Metastatic adenocarcinoma, consistent with a mullerian origin  V. Right liver, partial hepatectomy: Metastatic adenocarcinoma, consistent with a mullerian origin  W. Central diaphragm implants, excision: Metastatic adenocarcinoma, consistent with a mullerian origin  X. Splenic nodule, excision: Metastatic adenocarcinoma, consistent with a mullerian origin   The case was reviewed at GYN pathology meeting, and the above diagnosis reflects our division consensus.  The endometrial tumor is classified as FIGO grade 2 endometrioid adenocarcinoma. Multiple extrauterine sites, including the uterine serosa, adnexa, peritoneum,  omentum, gastrointestinal mesentery, porta hepatis, diaphragm, spleen, and liver, contain metastatic deposits of mllerian adenocarcinoma. Metastatic deposits exhibit higher-grade morphology; however, molecular profiling demonstrates a consistent p53 mutant pattern across all sites. These findings suggest a diagnosis  of primary endometrial origin with widespread metastatic spread.  Immunohistochemical stains performed on block A2 (omentum) show positive staining for AE1/AE3, EMA, SOX17, ER (40%, moderate intensity) while negative staining for PAX8. p53 stain shows mutant pattern staining.  Immunohistochemical stains performed on block G6 (endometrium) shows positive staining staining for ER (40%, moderate intensity) , PR (90%, strong intensity). p53 stain shows mutant pattern staining.  Mismatch Repair Protein (MMR) Nuclear Expression by IHC on block G8; serosal nodule: (present/intact) MLH1: Present PMS2: Present MSH2: Present MSH6: Present  IHC Interpretation: No loss of nuclear expression of MMR proteins: low probability of microsatellite instability-high (MSI-H)#  # There are exceptions to the above IHC interpretations. These results should not be considered in isolation, and clinical correlation with genetic counseling is recommended to assess the need for germline testing.     07/19/2024 3:53 PM EDT MIC[facility]    Addendum On July 16, 2024, Dr. Edsel Host submitted an order for additional stains/tests HER2 IHC Quant and the tissue block(s) was retrieved from archive storage and submitted to the [facility] clinical histology laboratory for additional processing and staining.  HER2 Immunohistochemistry:  Result: Negative (Score 1+)  All controls show appropriate reactivity.  HER2 protein expression is evaluated using DESTINY-PanTumor02 trial ([id number]) enrollment criteria for Trastuzumab-Deruxtecan use. It is evaluated by manual quantitative  immunohistochemistry on formalin-fixed, paraffin-embedded tissues, using clone [id number] (rabbit monoclonal, Ventana) on a Ventana auto-stainer. Membrane staining of tumor cells is evaluated and graded as follows:  Result Criteria for Surgical Specimens Criteria for Biopsy Specimens Negative (Score 0) No staining or membrane staining in less than 10% of tumor cells No staining in any tumor cells Negative (Score 1+) Faint/barely perceptible incomplete membrane staining in greater than or equal to 10% tumor cells Tumor cell cluster* with a faint/barely perceptible membrane staining irrespective of percentage of positive tumor cells Equivocal (Score 2+) Weak to moderate, complete, basolateral or lateral membrane staining in greater than or equal to10% of tumor cells Tumor cell cluster* with a weak to moderate, complete, basolateral or lateral membrane staining irrespective of percentage of positive tumor cells Positive (Score 3+) Strong, complete, basolateral or lateral membrane staining in greater than or equal to 10% of tumor cells Tumor cell cluster* with a strong, complete, basolateral or lateral membrane staining irrespective of percentage of positive tumor cells         06/23/2024 Imaging   1.  No pulmonary embolus.  2.  Small pleural effusions with mild adjacent atelectasis.  3.  Trace anterior right pneumothorax, a chest tube is in place.  4.  Majority of mediastinal lymph nodes are stable, while there are a few that  appear increased in size, potentially reactive although indeterminate in the  setting of malignancy. Attention on follow-up imaging.  5.  Partially imaged postoperative changes in the upper abdomen.    07/30/2024 -  Chemotherapy   Patient is on Treatment Plan : UTERINE Lenvatinib  (20) D1-21 + Pembrolizumab (200) D1 q21d     08/06/2024 Cancer Staging   Staging form: Corpus Uteri - Carcinoma and Carcinosarcoma, AJCC 8th Edition and FIGO 2023 - Pathologic: FIGO Stage IVB (pT4, pN0,  pM1) - Signed by Lonn Hicks, MD on 08/06/2024 Stage prefix: Initial diagnosis     MEDICAL HISTORY:  Past Medical History:  Diagnosis Date   Endometrial cancer (HCC) 11/18/2019   Dr. Lansing [place]  [facility]   Malignant neoplasm of endometrium [facility]) 11/02/2019   Formatting of this note is  different from the original.  Referral Dr. Darian Kopp.      10/09/19: EMB showed comnplex atypical hyperplasia, suspicious for endometrioid carcinoma. (Sent for outside consult).      10/15/19: EMB showed well differentiated endometrioid adenocarcinoma, FIGO gr 1.      OUTSIDE SLIDE REVIEW     A.  Endometrium, biopsy ([id number], Part A, 10/09/2019):   Microscopic foci     SURGICAL HISTORY: Past Surgical History:  Procedure Laterality Date   ABDOMINAL HYSTERECTOMY     DILATION AND CURETTAGE OF UTERUS     x2    SOCIAL HISTORY: Social History   Socioeconomic History   Marital status: Single    Spouse name: Not on file   Number of children: Not on file   Years of education: Not on file   Highest education level: Not on file  Occupational History   Not on file  Tobacco Use   Smoking status: Never   Smokeless tobacco: Never  Vaping Use   Vaping status: Never Used  Substance and Sexual Activity   Alcohol use: Yes    Comment: weekends   Drug use: Never   Sexual activity: Yes    Birth control/protection: I.U.D.  Other Topics Concern   Not on file  Social History Narrative   Not on file   Social Drivers of Health   Financial Resource Strain: Not on file  Food Insecurity: No Food Insecurity (06/24/2024)   Received from [place]  [facility]   NCSS - Food Insecurity    Worried About Running Out of Food in the Last Year: No    Ran Out of Food in the Last Year: No  Transportation Needs: No Transportation Needs (06/24/2024)   Received from [place]  [facility]   NCSS - Transportation    Lack of Transportation: No   Physical Activity: Not on file  Stress: Not on file  Social Connections: Unknown (06/17/2022)   Received from [facility]   Social Network    Social Network: Not on file  Intimate Partner Violence: Unknown (06/17/2022)   Received from [facility]   HITS    Physically Hurt: Not on file    Insult or Talk Down To: Not on file    Threaten Physical Harm: Not on file    Scream or Curse: Not on file    FAMILY HISTORY: Family History  Problem Relation Age of Onset   Colon cancer Mother        dx early 73s   Diabetes Father    Diabetes Paternal Grandmother    Ovarian cancer Neg Hx    Breast cancer Neg Hx    Endometrial cancer Neg Hx    Prostate cancer Neg Hx    Pancreatic cancer Neg Hx     ALLERGIES:  has no known allergies.  MEDICATIONS:  Current Outpatient Medications  Medication Sig Dispense Refill   acetaminophen (TYLENOL) 325 MG tablet Take 325 mg by mouth every 6 (six) hours as needed for moderate pain (pain score 4-6).     LENVIMA , 20 MG DAILY DOSE, 2 x 10 MG capsule Take 20 mg by mouth daily.     polyethylene glycol powder (GLYCOLAX/MIRALAX) 17 GM/SCOOP powder Take 17 g by mouth daily.     lenvatinib  20 mg daily dose (LENVIMA ) 2 x 10 MG capsule Take 2 capsules (20 mg total) by mouth daily. 60 capsule 11   lidocaine -prilocaine  (EMLA ) cream Apply to affected area once 30 g 3  prochlorperazine  (COMPAZINE ) 10 MG tablet Take 1 tablet (10 mg total) by mouth every 6 (six) hours as needed. 30 tablet 3   No current facility-administered medications for this visit.    REVIEW OF SYSTEMS:   Constitutional: Denies fevers, chills or abnormal night sweats Eyes: Denies blurriness of vision, double vision or watery eyes Ears, nose, mouth, throat, and face: Denies mucositis or sore throat Respiratory: Denies cough, dyspnea or wheezes Cardiovascular: Denies palpitation, chest discomfort or lower extremity swelling Gastrointestinal:  Denies nausea, heartburn or change in bowel  habits Skin: Denies abnormal skin rashes Lymphatics: Denies new lymphadenopathy or easy bruising Neurological:Denies numbness, tingling or new weaknesses Behavioral/Psych: Mood is stable, no new changes  All other systems were reviewed with the patient and are negative.  PHYSICAL EXAMINATION: ECOG PERFORMANCE STATUS: 0 - Asymptomatic  Vitals:   08/06/24 1406  BP: 123/86  Pulse: (!) 106  Resp: 18  Temp: 98.5 F (36.9 C)  SpO2: 100%   Filed Weights   08/06/24 1406  Weight: 129 lb 6.4 oz (58.7 kg)    GENERAL:alert, no distress and comfortable SKIN: skin color, texture, turgor are normal, no rashes or significant lesions EYES: normal, conjunctiva are pink and non-injected, sclera clear OROPHARYNX:no exudate, no erythema and lips, buccal mucosa, and tongue normal  NECK: supple, thyroid normal size, non-tender, without nodularity LYMPH:  no palpable lymphadenopathy in the cervical, axillary or inguinal LUNGS: clear to auscultation and percussion with normal breathing effort HEART: regular rate & rhythm and no murmurs and no lower extremity edema ABDOMEN:abdomen soft, non-tender and normal bowel sounds.  Noted well-healed surgical scars Musculoskeletal:no cyanosis of digits and no clubbing  PSYCH: alert & oriented x 3 with fluent speech NEURO: no focal motor/sensory deficits  LABORATORY DATA:  I have reviewed the data as listed in electronic records

## 2024-08-08 ENCOUNTER — Other Ambulatory Visit: Payer: Self-pay

## 2024-08-09 ENCOUNTER — Other Ambulatory Visit: Payer: Self-pay | Admitting: Pharmacist

## 2024-08-09 ENCOUNTER — Telehealth: Payer: Self-pay

## 2024-08-09 ENCOUNTER — Telehealth: Payer: Self-pay | Admitting: *Deleted

## 2024-08-09 ENCOUNTER — Other Ambulatory Visit (HOSPITAL_COMMUNITY): Payer: Self-pay

## 2024-08-09 ENCOUNTER — Encounter: Payer: Self-pay | Admitting: Hematology and Oncology

## 2024-08-09 DIAGNOSIS — C549 Malignant neoplasm of corpus uteri, unspecified: Secondary | ICD-10-CM

## 2024-08-09 MED ORDER — LENVATINIB (20 MG DAILY DOSE) 2 X 10 MG PO CPPK: 20.0000 mg | ORAL_CAPSULE | Freq: Every day | ORAL | 11 refills | Status: DC

## 2024-08-09 NOTE — Telephone Encounter (Addendum)
 Oral Oncology Patient Advocate Encounter  After completing a benefits investigation, prior authorization for Lenvima  is not required at this time through BC/BS.  Patient must fill at [facility].  Patient demographics and  medication list has been faxed over.   I will continue to follow up until a delivery date is set   Charlott Hamilton,  CPhT-Adv  she/her/hers [place]  [facility] Pharmacy Technician Patient Advocate Specialist III [facility] Phone: [phone]  Fax: [phone] [email or web address]

## 2024-08-09 NOTE — Telephone Encounter (Signed)
 08/06/2024 Late entry  Medication Prior Authorization Status  Processed CoverMyMeds KEY: [id number] for Lidocaine -Prilocaine  2.5-2.5% cream  Approved today  Per [employer]  PA Case ID: [id number]   Effective 08/06/2024 through 11/04/2024.

## 2024-08-10 ENCOUNTER — Telehealth: Payer: Self-pay | Admitting: Pharmacist

## 2024-08-10 NOTE — Telephone Encounter (Signed)
 [facility] expedited their process  due to patient needing meds by Friday.   I will continue to follow up until an expected delivery date.

## 2024-08-10 NOTE — Telephone Encounter (Signed)
 [facility]       Telephone: [phone]?Fax: [phone]   Oncology Clinical Pharmacist Practitioner Initial Assessment  Christina Alvarez is a 28 y.o. female with a diagnosis of endometrial cancer. They were contacted today via telephone visit.  I connected with Christina Alvarez today by telephone and verified that I was speaking with the correct person using two patient identifiers. I discussed the limitations, risks, security and privacy concerns of performing an evaluation and management service by telemedicine and the availability of in-person appointments. The patient/caregiver expressed understanding and agreed to proceed.  Other persons participating in the visit and their role in the encounter: none   Patient's location: home  Provider's location: clinic  Indication/Regimen Lenvatinib  (Lenvima ) is being used appropriately for treatment of endometrial cancer by Dr. Almarie Bedford.      Wt Readings from Last 1 Encounters:  08/06/24 129 lb 6.4 oz (58.7 kg)    Estimated body surface area is 1.6 meters squared as calculated from the following:   Height as of 08/06/24: 5' 2 (1.575 m).   Weight as of 08/06/24: 129 lb 6.4 oz (58.7 kg).  The dosing regimen is 2 capsules (20 mg total) by mouth once daily on days 1 to 28 of a 28-day cycle. This is being given  in combination with pembrolizumab. It is planned to continue until disease progression or unacceptable toxicity. Prescription dose and frequency assessed for appropriateness. The treatment goal is: Control.  Patient has agreed to treatment which is documented in physician note on 08/06/24. Counseled patient on administration, dosing, side effects, monitoring, drug-food interactions, safe handling, storage, and disposal.  Per Dr. Bedford, patient started lenvatinib  in [place]  but will be continuing care with Dr. Bedford in [facility]. [facility] is sending her next cycle of medication. She starts pembrolizumab here on 08/20/24  Dose  Modifications None   Access Assessment Christina Alvarez will be receiving lenvatinib  through [facility] Insurance Concerns: none Start date if known: started in [place]  on 07/30/24  Adherence Assessment Reviewed importance on keeping a med schedule and plan for any missed doses Barriers to adherence identified? No  Communication and Learning Assessment Primary learner: patient Barriers to learning: No barriers Preferred language: English Learning preferences: Listening   Allergies No Known Allergies  Vitals    08/06/2024    2:06 PM 12/25/2023    3:14 PM 01/08/2023    1:06 PM  Oncology Vitals  Height 158 cm  158 cm  Weight 58.695 kg 69.128 kg 65.953 kg  Weight (lbs) 129 lbs 6 oz 152 lbs 6 oz 145 lbs 6 oz  BMI 23.67 kg/m2 27.87 kg/m2 26.59 kg/m2  Temp 98.5 F (36.9 C) 99.1 F (37.3 C) 97.5 F (36.4 C)  Pulse Rate 106 106 93  BP 123/86 115/79 104/70  Resp 18 17   SpO2 100 % 100 % 98 %  BSA (m2) 1.6 m2 1.74 m2 1.7 m2     Laboratory Data     No data to display              No data to display         Contraindications Contraindications were reviewed? Yes Contraindications to therapy were identified? No   Safety Precautions The following safety precautions for the use of lenvatinib  were reviewed:  Fever: reviewed the importance of having a thermometer and the Centers for Disease Control and Prevention (CDC) definition of fever which is 100.31F (38C) or higher. Patient should call 24/7 triage at [phone]  [phone] if experiencing a fever or any other symptoms Abdominal pain Decreased appetite or weight loss Diarrhea (loose and/or urgent bowel movements) Fatigue Headache Increased blood pressure Muscle or joint pain or weakness Nausea or vomiting Stomatitis Take with or without food Bowel perforation Changes in electrolytes and other laboratory values Changes in liver function Chest pain or tightness Harmful to thyroid Increased risk for  bleeding Kidney damaage Low calcium Pain or discomfort in hands and/or feet Posterior reversible leukoencephalopathy syndrome (PRES) -- severe headache, seizures, confusion, changes in vision QT prolongation Surgical or dental procedures Voice changes or hoarseness VTE Missed doses Handing body fluids and waste Pregnancy, sexual activity, and contraception Storage and handling  Medication Reconciliation Current Outpatient Medications  Medication Sig Dispense Refill   acetaminophen (TYLENOL) 325 MG tablet Take 325 mg by mouth every 6 (six) hours as needed for moderate pain (pain score 4-6).     lidocaine -prilocaine  (EMLA ) cream Apply to affected area once 30 g 3   polyethylene glycol powder (GLYCOLAX/MIRALAX) 17 GM/SCOOP powder Take 17 g by mouth daily.     prochlorperazine  (COMPAZINE ) 10 MG tablet Take 1 tablet (10 mg total) by mouth every 6 (six) hours as needed. 30 tablet 3   lenvatinib  20 mg daily dose (LENVIMA ) 2 x 10 MG capsule Take 2 capsules (20 mg total) by mouth daily. 60 capsule 11   No current facility-administered medications for this visit.    Medication reconciliation is based on the patient's most recent medication list in the electronic medical record (EMR) including herbal products and OTC medications.   The patient's medication list was reviewed today with the patient? Yes   Drug-drug interactions (DDIs) DDIs were evaluated? Yes Significant DDIs identified? No   Drug-Food Interactions Drug-food interactions were evaluated? Yes Drug-food interactions identified? No   Follow-up Plan  Patient education handout given to patient Start lenvatinib  20 mg once daily by mouth.  Start pembrolizumab 200 mg IV every 21 days. Due 08/20/24 Monitor for side effects Port placement is 08/13/24 Lab, Dr. Lonn visit on 08/17/24 along with patient education for pembrolizumab Christina Alvarez can follow up with clinical pharmacy as deemed necessary by Dr. Almarie Lonn going forward    Christina Alvarez participated in the discussion, expressed understanding, and voiced agreement with the above plan. All questions were answered to their satisfaction. The patient was advised to contact the clinic at [phone] with any questions or concerns prior to their return visit.   I spent 30 minutes assessing the patient.  Christina Alvarez A. Lucila, PharmD, BCOP, CPP  Norleen DELENA Lucila, RPH-CPP, 08/10/2024 11:09 AM  **Disclaimer: This note was dictated with voice recognition software. Similar sounding words can inadvertently be transcribed and this note may contain transcription errors which may not have been corrected upon publication of note.**

## 2024-08-11 ENCOUNTER — Encounter: Payer: Self-pay | Admitting: Hematology and Oncology

## 2024-08-11 ENCOUNTER — Other Ambulatory Visit (HOSPITAL_COMMUNITY): Payer: Self-pay

## 2024-08-11 ENCOUNTER — Telehealth: Payer: Self-pay | Admitting: *Deleted

## 2024-08-11 ENCOUNTER — Other Ambulatory Visit: Payer: Self-pay | Admitting: Pharmacist

## 2024-08-11 DIAGNOSIS — C549 Malignant neoplasm of corpus uteri, unspecified: Secondary | ICD-10-CM

## 2024-08-11 MED ORDER — LENVATINIB (20 MG DAILY DOSE) 2 X 10 MG PO CPPK: 20.0000 mg | ORAL_CAPSULE | Freq: Every day | ORAL | 11 refills | Status: DC

## 2024-08-11 NOTE — Telephone Encounter (Signed)
 Oral Oncology Patient Advocate Encounter  Followed up with [facility] expected delivery date is Friday 08/13/24 for Lenvima 

## 2024-08-11 NOTE — Telephone Encounter (Signed)
 PC to patient following Fax from fax from [employer], patient's VM message and MyChart message: Concern about surgical scar. It's red and irritated. Picture sent w/MyChart.  Per fax from [facility] call today at [phone]: patient reported incision is red since Monday at bottom of incision near vaginal area. It doesn't 'look as closed as it did before'. Opaque yellow discharge. Pain 3/10. Per patient - Area is at bottom of incision - less than an inch in length. Denies increase in tenderness or redness. Denies fever.   Informed [facility] PA of patient concerns as well as NP in [facility]. Both recommended patient send picture from MyChart to surgical team in [place]  to update. Per NP in GYN - patient should cover open area, can use neosporin if not allergic. Continue to observe and if any changes before appt here on 9/2, contact office.   Contacted patient with recommendations as above. Also recommended she wear soft clothing over area. If she develops fever, increased or change in discharge or increased pain, contact office. Christina Alvarez verbalized understanding of all recommendations. She stated she will contact surgical team in [place]  and let office know outcome.

## 2024-08-11 NOTE — Telephone Encounter (Signed)
 Oral Oncology Patient Advocate Encounter  I followed up with [facility] for an expected  delivery date, I was notified they where not in network to fill. After follow up the patient's BC/BS plan  [facility] is the preferred pharmacy for specialty meds.  Currently waiting for a new prescription to release to [facility]  (She is already an established patient with them)

## 2024-08-12 ENCOUNTER — Other Ambulatory Visit: Payer: Self-pay | Admitting: Radiology

## 2024-08-12 NOTE — H&P (Signed)
 Chief Complaint: Uterine cancer, poor venous access; referred for Port-A-Cath placement to assist with treatment  Referring Provider(s): Gorsuch,N  Supervising Physician: Philip Cornet  Patient Status: [facility] - Out-pt  History of Present Illness: Christina Alvarez is a 28 y.o. female with history of uterine cancer initially diagnosed in 2020, subsequently lost to follow-up and now found to have significant disease burden earlier this year.  She reports prior hx hysterectomy/BSO/liver resection/cholecystectomy/appendectomy earlier this year in [place] . She is scheduled today for Port-A-Cath placement to assist with treatment.   Patient is Full Code  Past Medical History:  Diagnosis Date   Endometrial cancer (HCC) 11/18/2019   Dr. [place]  [facility]   Malignant neoplasm of endometrium [facility]) 11/02/2019   Formatting of this note is different from the original.  Referral Dr. Darian Kopp.      10/09/19: EMB showed comnplex atypical hyperplasia, suspicious for endometrioid carcinoma. (Sent for outside consult).      10/15/19: EMB showed well differentiated endometrioid adenocarcinoma, FIGO gr 1.      OUTSIDE SLIDE REVIEW     A.  Endometrium, biopsy ([id number], Part A, 10/09/2019):   Microscopic foci     Past Surgical History:  Procedure Laterality Date   ABDOMINAL HYSTERECTOMY     DILATION AND CURETTAGE OF UTERUS     x2    Allergies: Patient has no known allergies.  Medications: Prior to Admission medications   Medication Sig Start Date End Date Taking? Authorizing Provider  acetaminophen (TYLENOL) 325 MG tablet Take 325 mg by mouth every 6 (six) hours as needed for moderate pain (pain score 4-6). 06/28/24   [provider]  lenvatinib  20 mg daily dose (LENVIMA ) 2 x 10 MG capsule Take 2 capsules (20 mg total) by mouth daily. 08/11/24   Lonn Hicks, MD  lidocaine -prilocaine  (EMLA ) cream Apply to affected area once 08/06/24   Lonn Hicks, MD  polyethylene  glycol powder (GLYCOLAX/MIRALAX) 17 GM/SCOOP powder Take 17 g by mouth daily. 06/29/24   [provider]  prochlorperazine  (COMPAZINE ) 10 MG tablet Take 1 tablet (10 mg total) by mouth every 6 (six) hours as needed. 08/06/24   Lonn Hicks, MD     Family History  Problem Relation Age of Onset   Colon cancer Mother        dx early 35s   Diabetes Father    Diabetes Paternal Grandmother    Ovarian cancer Neg Hx    Breast cancer Neg Hx    Endometrial cancer Neg Hx    Prostate cancer Neg Hx    Pancreatic cancer Neg Hx     Social History   Socioeconomic History   Marital status: Single    Spouse name: Not on file   Number of children: Not on file   Years of education: Not on file   Highest education level: Not on file  Occupational History   Not on file  Tobacco Use   Smoking status: Never   Smokeless tobacco: Never  Vaping Use   Vaping status: Never Used  Substance and Sexual Activity   Alcohol use: Yes    Comment: weekends   Drug use: Never   Sexual activity: Yes    Birth control/protection: I.U.D.  Other Topics Concern   Not on file  Social History Narrative   Not on file   Social Drivers of Health   Financial Resource Strain: Not on file  Food Insecurity: No Food Insecurity (06/24/2024)   [facility] from [place]  [facility]   NCSS - Food Insecurity    Worried About Programme researcher, broadcasting/film/video in the Last Year: No    Ran Out of Food in the Last Year: No  Transportation Needs: No Transportation Needs (06/24/2024)   Received from [place]  [facility]   NCSS - Transportation    Lack of Transportation: No  Physical Activity: Not on file  Stress: Not on file  Social Connections: Unknown (06/17/2022)   Received from [facility]   Social Network    Social Network: Not on file       Review of Systems: denies fever, HA,CP,dyspnea, cough, abd/back pain,N/V; she does have some vaginal bleeding  Vital Signs: Vitals:    08/13/24 1237  BP: (!) 134/92  Pulse: 85  Resp: 16  Temp: 98.2 F (36.8 C)  SpO2: 97%      Advance Care Plan: No documents on file   Physical Exam: awake/alert; chest- CTA bilat; heart- RRR; abd-soft,+BS,NT; no LE edema  Imaging: No results found.  Labs:  CBC: No results for input(s): WBC, HGB, HCT, PLT in the last 8760 hours.  COAGS: No results for input(s): INR, APTT in the last 8760 hours.  BMP: No results for input(s): NA, K, CL, CO2, GLUCOSE, BUN, CALCIUM, CREATININE, GFRNONAA, GFRAA in the last 8760 hours.  Invalid input(s): CMP  LIVER FUNCTION TESTS: No results for input(s): BILITOT, AST, ALT, ALKPHOS, PROT, ALBUMIN in the last 8760 hours.  TUMOR MARKERS: No results for input(s): AFPTM, CEA, CA199, CHROMGRNA in the last 8760 hours.  Assessment and Plan: 28 y.o. female with history of uterine cancer initially diagnosed in 2020, subsequently lost to follow-up and now found to have significant disease burden earlier this year.  She reports prior hx hysterectomy/BSO/liver resection/cholecystectomy/appendectomy earlier this year in [place] . She is scheduled today for Port-A-Cath placement to assist with treatment.Risks and benefits of image guided port-a-catheter placement was discussed with the patient including, but not limited to bleeding, infection, pneumothorax, or fibrin sheath development and need for additional procedures.  All of the patient's questions were answered, patient is agreeable to proceed. Consent signed and in chart.    Thank you for allowing our service to participate in Iysis Germain 's care.  Electronically Signed: D. Franky Rakers, PA-C   08/12/2024, 6:02 PM      I spent a total of  20 minutes   in face to face in clinical consultation, greater than 50% of which was counseling/coordinating care for port a cath placement

## 2024-08-13 ENCOUNTER — Ambulatory Visit (HOSPITAL_COMMUNITY)
Admission: RE | Admit: 2024-08-13 | Discharge: 2024-08-13 | Disposition: A | Discharge status: Home / Self Care | Source: Ambulatory Visit | Attending: Hematology and Oncology | Admitting: Hematology and Oncology

## 2024-08-13 ENCOUNTER — Other Ambulatory Visit: Payer: Self-pay

## 2024-08-13 ENCOUNTER — Encounter (HOSPITAL_COMMUNITY): Payer: Self-pay

## 2024-08-13 ENCOUNTER — Ambulatory Visit: Admitting: Gynecologic Oncology

## 2024-08-13 DIAGNOSIS — Z9049 Acquired absence of other specified parts of digestive tract: Secondary | ICD-10-CM | POA: Insufficient documentation

## 2024-08-13 DIAGNOSIS — C55 Malignant neoplasm of uterus, part unspecified: Secondary | ICD-10-CM | POA: Diagnosis present

## 2024-08-13 DIAGNOSIS — Z90710 Acquired absence of both cervix and uterus: Secondary | ICD-10-CM | POA: Diagnosis not present

## 2024-08-13 DIAGNOSIS — Z90722 Acquired absence of ovaries, bilateral: Secondary | ICD-10-CM | POA: Diagnosis not present

## 2024-08-13 DIAGNOSIS — C549 Malignant neoplasm of corpus uteri, unspecified: Secondary | ICD-10-CM

## 2024-08-13 DIAGNOSIS — C786 Secondary malignant neoplasm of retroperitoneum and peritoneum: Secondary | ICD-10-CM | POA: Insufficient documentation

## 2024-08-13 HISTORY — PX: IR IMAGING GUIDED PORT INSERTION: IMG5740

## 2024-08-13 MED ORDER — SODIUM CHLORIDE 0.9 % IV SOLN: INTRAVENOUS | Status: DC

## 2024-08-13 MED ORDER — DIPHENHYDRAMINE HCL 50 MG/ML IJ SOLN
INTRAMUSCULAR | Status: AC
  Filled 2024-08-13: qty 1

## 2024-08-13 MED ORDER — LIDOCAINE-EPINEPHRINE 1 %-1:100000 IJ SOLN
20.0000 mL | Freq: Once | INTRAMUSCULAR | Status: AC
  Administered 2024-08-13: 10 mL via INTRADERMAL

## 2024-08-13 MED ORDER — LIDOCAINE HCL 1 % IJ SOLN
INTRAMUSCULAR | Status: AC
  Filled 2024-08-13: qty 20

## 2024-08-13 MED ORDER — HEPARIN SOD (PORK) LOCK FLUSH 100 UNIT/ML IV SOLN
500.0000 [IU] | Freq: Once | INTRAVENOUS | Status: AC
  Administered 2024-08-13: 500 [IU] via INTRAVENOUS

## 2024-08-13 MED ORDER — HEPARIN SOD (PORK) LOCK FLUSH 100 UNIT/ML IV SOLN
INTRAVENOUS | Status: AC
Start: 2024-08-13 — End: 2024-08-13
  Filled 2024-08-13: qty 5

## 2024-08-13 MED ORDER — DIPHENHYDRAMINE HCL 50 MG/ML IJ SOLN
INTRAMUSCULAR | Status: AC | PRN
  Administered 2024-08-13: 25 mg via INTRAVENOUS

## 2024-08-13 MED ORDER — IOHEXOL 300 MG/ML  SOLN
100.0000 mL | Freq: Once | INTRAMUSCULAR | Status: AC | PRN
  Administered 2024-08-13: 100 mL via INTRAVENOUS

## 2024-08-13 MED ORDER — LIDOCAINE-EPINEPHRINE 1 %-1:100000 IJ SOLN
INTRAMUSCULAR | Status: AC
  Filled 2024-08-13: qty 1

## 2024-08-13 MED ORDER — MIDAZOLAM HCL 2 MG/2ML IJ SOLN
INTRAMUSCULAR | Status: AC
  Filled 2024-08-13: qty 4

## 2024-08-13 MED ORDER — FENTANYL CITRATE (PF) 100 MCG/2ML IJ SOLN
INTRAMUSCULAR | Status: AC | PRN
  Administered 2024-08-13 (×2): 50 ug via INTRAVENOUS

## 2024-08-13 MED ORDER — LIDOCAINE HCL 1 % IJ SOLN
20.0000 mL | Freq: Once | INTRAMUSCULAR | Status: AC
  Administered 2024-08-13: 10 mL via INTRADERMAL

## 2024-08-13 MED ORDER — FENTANYL CITRATE (PF) 100 MCG/2ML IJ SOLN
INTRAMUSCULAR | Status: AC
  Filled 2024-08-13: qty 2

## 2024-08-13 MED ORDER — MIDAZOLAM HCL 2 MG/2ML IJ SOLN
INTRAMUSCULAR | Status: AC | PRN
  Administered 2024-08-13 (×3): 1 mg via INTRAVENOUS

## 2024-08-13 NOTE — Progress Notes (Signed)
 Short Stay Nursing Note: alert and oriented at DC time, VSS, pain free, tolerating POs well, Dsg at rt ant chest CDI, bandaid also CDI. Has strong rt radial pulse, capillary refill WNL, color WNL, denies any numbness or tingling. Able to dress herself, gait steady. AVS reviewed with client also provided copy of AVS and Port card and folder. Opportunity for questions provided prior to DC from Short Stay.

## 2024-08-13 NOTE — Procedures (Signed)
 Interventional Radiology Procedure:   Indications: Metastatic uterine cancer  Procedure: Port placement  Findings: Right jugular port, tip at SVC/RA junction.    Complications: None     EBL: Minimal, less than 10 ml  Plan: Discharge in one hour.  Keep port site and incisions dry for at least 24 hours.     Christina Alvarez R. Philip, MD  Pager: [phone]

## 2024-08-17 ENCOUNTER — Emergency Department (HOSPITAL_BASED_OUTPATIENT_CLINIC_OR_DEPARTMENT_OTHER)

## 2024-08-17 ENCOUNTER — Other Ambulatory Visit: Payer: Self-pay

## 2024-08-17 ENCOUNTER — Inpatient Hospital Stay

## 2024-08-17 ENCOUNTER — Inpatient Hospital Stay: Attending: Hematology and Oncology

## 2024-08-17 ENCOUNTER — Inpatient Hospital Stay: Admitting: Hematology and Oncology

## 2024-08-17 ENCOUNTER — Emergency Department (HOSPITAL_BASED_OUTPATIENT_CLINIC_OR_DEPARTMENT_OTHER): Admission: EM | Admit: 2024-08-17 | Discharge: 2024-08-17 | Disposition: A | Discharge status: Home / Self Care

## 2024-08-17 ENCOUNTER — Encounter (HOSPITAL_BASED_OUTPATIENT_CLINIC_OR_DEPARTMENT_OTHER): Payer: Self-pay | Admitting: Emergency Medicine

## 2024-08-17 DIAGNOSIS — R300 Dysuria: Secondary | ICD-10-CM | POA: Insufficient documentation

## 2024-08-17 DIAGNOSIS — N952 Postmenopausal atrophic vaginitis: Secondary | ICD-10-CM | POA: Insufficient documentation

## 2024-08-17 DIAGNOSIS — Z5112 Encounter for antineoplastic immunotherapy: Secondary | ICD-10-CM | POA: Insufficient documentation

## 2024-08-17 DIAGNOSIS — R140 Abdominal distension (gaseous): Secondary | ICD-10-CM | POA: Insufficient documentation

## 2024-08-17 DIAGNOSIS — C7889 Secondary malignant neoplasm of other digestive organs: Secondary | ICD-10-CM | POA: Insufficient documentation

## 2024-08-17 DIAGNOSIS — N939 Abnormal uterine and vaginal bleeding, unspecified: Secondary | ICD-10-CM | POA: Insufficient documentation

## 2024-08-17 DIAGNOSIS — Z9221 Personal history of antineoplastic chemotherapy: Secondary | ICD-10-CM | POA: Insufficient documentation

## 2024-08-17 DIAGNOSIS — C7911 Secondary malignant neoplasm of bladder: Secondary | ICD-10-CM | POA: Insufficient documentation

## 2024-08-17 DIAGNOSIS — Z8542 Personal history of malignant neoplasm of other parts of uterus: Secondary | ICD-10-CM | POA: Diagnosis not present

## 2024-08-17 DIAGNOSIS — R309 Painful micturition, unspecified: Secondary | ICD-10-CM | POA: Insufficient documentation

## 2024-08-17 DIAGNOSIS — E8940 Asymptomatic postprocedural ovarian failure: Secondary | ICD-10-CM | POA: Insufficient documentation

## 2024-08-17 DIAGNOSIS — T451X5A Adverse effect of antineoplastic and immunosuppressive drugs, initial encounter: Secondary | ICD-10-CM | POA: Insufficient documentation

## 2024-08-17 DIAGNOSIS — D696 Thrombocytopenia, unspecified: Secondary | ICD-10-CM | POA: Insufficient documentation

## 2024-08-17 DIAGNOSIS — R232 Flushing: Secondary | ICD-10-CM | POA: Insufficient documentation

## 2024-08-17 DIAGNOSIS — C549 Malignant neoplasm of corpus uteri, unspecified: Secondary | ICD-10-CM

## 2024-08-17 DIAGNOSIS — R1031 Right lower quadrant pain: Secondary | ICD-10-CM | POA: Diagnosis present

## 2024-08-17 DIAGNOSIS — C787 Secondary malignant neoplasm of liver and intrahepatic bile duct: Secondary | ICD-10-CM | POA: Insufficient documentation

## 2024-08-17 DIAGNOSIS — C786 Secondary malignant neoplasm of retroperitoneum and peritoneum: Secondary | ICD-10-CM | POA: Insufficient documentation

## 2024-08-17 DIAGNOSIS — R109 Unspecified abdominal pain: Secondary | ICD-10-CM

## 2024-08-17 DIAGNOSIS — K5900 Constipation, unspecified: Secondary | ICD-10-CM | POA: Insufficient documentation

## 2024-08-17 DIAGNOSIS — Z90710 Acquired absence of both cervix and uterus: Secondary | ICD-10-CM | POA: Insufficient documentation

## 2024-08-17 DIAGNOSIS — Z79620 Long term (current) use of immunosuppressive biologic: Secondary | ICD-10-CM | POA: Insufficient documentation

## 2024-08-17 DIAGNOSIS — L271 Localized skin eruption due to drugs and medicaments taken internally: Secondary | ICD-10-CM | POA: Insufficient documentation

## 2024-08-17 LAB — BASIC METABOLIC PANEL WITH GFR
Anion gap: 14 (ref 5–15)
BUN: 9 mg/dL (ref 6–20)
CO2: 24 mmol/L (ref 22–32)
Calcium: 9.8 mg/dL (ref 8.9–10.3)
Chloride: 102 mmol/L (ref 98–111)
Creatinine, Ser: 0.73 mg/dL (ref 0.44–1.00)
GFR, Estimated: >60 mL/min (ref >60)
Glucose, Bld: 85 mg/dL (ref 70–99)
Potassium: 3.8 mmol/L (ref 3.5–5.1)
Sodium: 140 mmol/L (ref 135–145)

## 2024-08-17 LAB — HEPATIC FUNCTION PANEL
ALT: 23 U/L (ref 0–44)
AST: 27 U/L (ref 15–41)
Albumin: 4.5 g/dL (ref 3.5–5.0)
Alkaline Phosphatase: 48 U/L (ref 38–126)
Bilirubin, Direct: 0.4 mg/dL — ABNORMAL HIGH (ref 0.0–0.2)
Indirect Bilirubin: 0.7 mg/dL (ref 0.3–0.9)
Total Bilirubin: 1.1 mg/dL (ref 0.0–1.2)
Total Protein: 7.8 g/dL (ref 6.5–8.1)

## 2024-08-17 LAB — CBC WITH DIFFERENTIAL/PLATELET
Abs Immature Granulocytes: 0.02 K/uL (ref 0.00–0.07)
Basophils Absolute: 0 K/uL (ref 0.0–0.1)
Basophils Relative: 0 %
Eosinophils Absolute: 0 K/uL (ref 0.0–0.5)
Eosinophils Relative: 1 %
HCT: 39.4 % (ref 36.0–46.0)
Hemoglobin: 13.4 g/dL (ref 12.0–15.0)
Immature Granulocytes: 0 %
Lymphocytes Relative: 43 %
Lymphs Abs: 1.9 K/uL (ref 0.7–4.0)
MCH: 28.9 pg (ref 26.0–34.0)
MCHC: 34 g/dL (ref 30.0–36.0)
MCV: 85.1 fL (ref 80.0–100.0)
Monocytes Absolute: 0.3 K/uL (ref 0.1–1.0)
Monocytes Relative: 6 %
Neutro Abs: 2.2 K/uL (ref 1.7–7.7)
Neutrophils Relative %: 50 %
Platelets: 109 K/uL — ABNORMAL LOW (ref 150–400)
RBC: 4.63 MIL/uL (ref 3.87–5.11)
RDW: 15.3 % (ref 11.5–15.5)
WBC: 4.5 K/uL (ref 4.0–10.5)
nRBC: 0 % (ref 0.0–0.2)

## 2024-08-17 LAB — HCG, SERUM, QUALITATIVE: Preg, Serum: NEGATIVE

## 2024-08-17 LAB — LIPASE, BLOOD: Lipase: 16 U/L (ref 11–51)

## 2024-08-17 MED ORDER — IOHEXOL 300 MG/ML  SOLN
100.0000 mL | Freq: Once | INTRAMUSCULAR | Status: AC | PRN
  Administered 2024-08-17: 100 mL via INTRAVENOUS

## 2024-08-17 MED ORDER — HYDROMORPHONE HCL 1 MG/ML IJ SOLN
0.5000 mg | Freq: Once | INTRAMUSCULAR | Status: AC
  Administered 2024-08-17: 0.5 mg via INTRAVENOUS
  Filled 2024-08-17: qty 1

## 2024-08-17 MED ORDER — SODIUM CHLORIDE 0.9 % IV BOLUS
500.0000 mL | Freq: Once | INTRAVENOUS | Status: AC
  Administered 2024-08-17: 500 mL via INTRAVENOUS

## 2024-08-17 MED ORDER — ONDANSETRON HCL 4 MG/2ML IJ SOLN
4.0000 mg | Freq: Once | INTRAMUSCULAR | Status: AC
  Administered 2024-08-17: 4 mg via INTRAVENOUS
  Filled 2024-08-17: qty 2

## 2024-08-17 NOTE — ED Triage Notes (Signed)
 Pt states woke up fine, took a shower and after developed sever abd pain, states had a port put in on Friday due to uterine cancer.

## 2024-08-17 NOTE — ED Notes (Signed)
 Patient transported to CT

## 2024-08-17 NOTE — ED Provider Notes (Signed)
 [place] [facility] Provider Note   CSN: [id number] Arrival date & time: 08/17/24  9348     Patient presents with: Abdominal Pain   Christina Alvarez is a 28 y.o. female.   28 year old female with history of endometrial cancer presents for evaluation of abdominal pain.  She states she felt fine when she woke up this morning but had not was showering and had sudden onset of abdominal pain.  States is located in the middle and lower right side.  States has been constant.  She is currently taking at home chemo pills.  She states she has had surgery that included removal of her ovaries, fallopian tubes, part of her liver gallbladder.  She admits to some nausea but no vomiting.  Denies any other symptoms or concerns.   Abdominal Pain Associated symptoms: nausea   Associated symptoms: no chest pain, no chills, no cough, no dysuria, no fever, no hematuria, no shortness of breath, no sore throat and no vomiting        Prior to Admission medications   Medication Sig Start Date End Date Taking? Authorizing Provider  acetaminophen (TYLENOL) 325 MG tablet Take 325 mg by mouth every 6 (six) hours as needed for moderate pain (pain score 4-6). 06/28/24   [provider]  lenvatinib  20 mg daily dose (LENVIMA ) 2 x 10 MG capsule Take 2 capsules (20 mg total) by mouth daily. 08/11/24   Lonn Hicks, MD  lidocaine -prilocaine  (EMLA ) cream Apply to affected area once 08/06/24   Lonn Hicks, MD  polyethylene glycol powder (GLYCOLAX/MIRALAX) 17 GM/SCOOP powder Take 17 g by mouth daily. 06/29/24   [provider]  prochlorperazine  (COMPAZINE ) 10 MG tablet Take 1 tablet (10 mg total) by mouth every 6 (six) hours as needed. 08/06/24   Lonn Hicks, MD    Allergies: Patient has no known allergies.    Review of Systems  Constitutional:  Negative for chills and fever.  HENT:  Negative for ear pain and sore throat.   Eyes:  Negative for pain and visual disturbance.   Respiratory:  Negative for cough and shortness of breath.   Cardiovascular:  Negative for chest pain and palpitations.  Gastrointestinal:  Positive for abdominal pain and nausea. Negative for vomiting.  Genitourinary:  Negative for dysuria and hematuria.  Musculoskeletal:  Negative for arthralgias and back pain.  Skin:  Negative for color change and rash.  Neurological:  Negative for seizures and syncope.  All other systems reviewed and are negative.   Updated Vital Signs BP (!) 127/97 (BP Location: Left Arm)   Pulse 87   Temp 97.9 F (36.6 C) (Oral)   Resp 16   Ht 5' 2 (1.575 m)   Wt 59 kg   LMP  (LMP Unknown)   SpO2 100%   BMI 23.78 kg/m   Physical Exam Vitals and nursing note reviewed.  Constitutional:      General: She is not in acute distress.    Appearance: She is well-developed.  HENT:     Head: Normocephalic and atraumatic.  Eyes:     Conjunctiva/sclera: Conjunctivae normal.  Cardiovascular:     Rate and Rhythm: Normal rate and regular rhythm.     Heart sounds: No murmur heard. Pulmonary:     Effort: Pulmonary effort is normal. No respiratory distress.     Breath sounds: Normal breath sounds.  Abdominal:     General: There is distension.     Palpations: Abdomen is soft.  Tenderness: There is abdominal tenderness.  Musculoskeletal:        General: No swelling.     Cervical back: Neck supple.  Skin:    General: Skin is warm and dry.     Capillary Refill: Capillary refill takes less than 2 seconds.  Neurological:     Mental Status: She is alert.  Psychiatric:        Mood and Affect: Mood normal.     (all labs ordered are listed, but only abnormal results are displayed) Labs Reviewed  CBC WITH DIFFERENTIAL/PLATELET - Abnormal; Notable for the following components:      Result Value   Platelets 109 (*)    All other components within normal limits  HEPATIC FUNCTION PANEL - Abnormal; Notable for the following components:   Bilirubin, Direct 0.4 (*)     All other components within normal limits  BASIC METABOLIC PANEL WITH GFR  LIPASE, BLOOD  HCG, SERUM, QUALITATIVE  URINALYSIS, ROUTINE W REFLEX MICROSCOPIC    EKG: None  Radiology: CT ABDOMEN PELVIS W CONTRAST Result Date: 08/17/2024 CLINICAL DATA:  Unchanged appearance of peritoneal implants along the hepatic capsule. No acute findings in the abdomen or pelvis. EXAM: CT ABDOMEN AND PELVIS WITH CONTRAST TECHNIQUE: Multidetector CT imaging of the abdomen and pelvis was performed using the standard protocol following bolus administration of intravenous contrast. RADIATION DOSE REDUCTION: This exam was performed according to the departmental dose-optimization program which includes automated exposure control, adjustment of the mA and/or kV according to patient size and/or use of iterative reconstruction technique. CONTRAST:  OMNIPAQUE  IOHEXOL  300 MG/ML  SOLN COMPARISON:  August 13, 2024 FINDINGS: Lower chest: Unchanged size of small right cardiophrenic lymph node measuring up to 0.8 cm. Hepatobiliary: Similar appearance of peritoneal lesions along the hepatic capsule. Gallbladder likely surgically absent. No biliary ductal dilation. Pancreas: Unremarkable. Spleen: Unremarkable. Adrenals/Urinary Tract: Adrenal glands are unremarkable. No nephrolithiasis or hydronephrosis. Stomach/Bowel: No evidence of bowel obstruction or inflammation. Vascular/Lymphatic: Normal caliber aorta. No lymphadenopathy by size criteria. Reproductive: Uterus and ovaries surgically absent. Unchanged appearance of the vaginal cuff. Other: No free air or free fluid. No new or enlarging peritoneal implants identified. Musculoskeletal: No acute osseous findings. IMPRESSION: Similar appearance of peritoneal lesions along the hepatic capsule and small right cardiophrenic lymph node. No acute findings in the abdomen or pelvis. Electronically Signed   By: Michaeline Blanch M.D.   On: 08/17/2024 10:21     Procedures   Medications  Ordered in the ED  sodium chloride  0.9 % bolus 500 mL (0 mLs Intravenous Stopped 08/17/24 0858)  ondansetron  (ZOFRAN ) injection 4 mg (4 mg Intravenous Given 08/17/24 0755)  HYDROmorphone  (DILAUDID ) injection 0.5 mg (0.5 mg Intravenous Given 08/17/24 0754)  iohexol  (OMNIPAQUE ) 300 MG/ML solution 100 mL (100 mLs Intravenous Contrast Given 08/17/24 9077)                                    Medical Decision Making Cardiac monitor interpretation: Sinus rhythm, no ectopy  Patient here for abdominal pain that was sudden onset.  Lab work and CT scan negative for acute abnormality.  She does have a history of endometrial carcinoma which she is being treated for.  I gave her Dilaudid  Zofran  IV fluids here and she is feeling much better.  She has nausea medication at home and will plan to stay on her medications as prescribed.  Advised to follow-up with primary care and  her specialist and return to the ER for new or worsening symptoms.  She feels comfortable plan be discharged.  Problems Addressed: Abdominal pain, non-surgical: undiagnosed new problem with uncertain prognosis History of endometrial cancer: chronic illness or injury  Amount and/or Complexity of Data Reviewed External Data Reviewed: notes.    Details: Outpatient records reviewed and patient follows up with oncology and is currently on oral chemo Labs: ordered. Decision-making details documented in ED Course.    Details: Ordered and reviewed by me and unremarkable, CBC is within normal limit, BMP shows normal electrolytes and creatinine Radiology: ordered and independent interpretation performed. Decision-making details documented in ED Course.    Details: CT abdomen pelvis ordered and reviewed by me and shows no acute abnormality in the abdomen  Risk OTC drugs. Prescription drug management. Parenteral controlled substances. Drug therapy requiring intensive monitoring for toxicity.    Final diagnoses:  Abdominal pain, non-surgical   History of endometrial cancer    ED Discharge Orders     None          Gennaro Duwaine CROME, DO 08/17/24 1055

## 2024-08-17 NOTE — Telephone Encounter (Signed)
 She is coming at 4 pm! Andrea CHRISTELLA Plunk, RN

## 2024-08-17 NOTE — ED Notes (Signed)
Patient transported to CT and back without event.

## 2024-08-17 NOTE — Discharge Instructions (Addendum)
 Stay on your medications as prescribed.  Call and follow-up with your primary care doctor and your specialist.  Return to the ER for new or worsening symptoms.

## 2024-08-18 ENCOUNTER — Encounter: Payer: Self-pay | Admitting: Hematology and Oncology

## 2024-08-19 ENCOUNTER — Telehealth: Payer: Self-pay | Admitting: Oncology

## 2024-08-19 ENCOUNTER — Other Ambulatory Visit: Payer: Self-pay | Admitting: Hematology and Oncology

## 2024-08-19 DIAGNOSIS — C549 Malignant neoplasm of corpus uteri, unspecified: Secondary | ICD-10-CM

## 2024-08-19 NOTE — Telephone Encounter (Signed)
 Left a message regarding the MyChart message from today.  Advised her that a port flush lab has been scheduled for 9:30 tomorrow to check her urine and labs. Requested a return call to confirm.

## 2024-08-20 ENCOUNTER — Other Ambulatory Visit: Payer: Self-pay | Admitting: Hematology and Oncology

## 2024-08-20 ENCOUNTER — Encounter: Payer: Self-pay | Admitting: Hematology and Oncology

## 2024-08-20 ENCOUNTER — Inpatient Hospital Stay

## 2024-08-20 ENCOUNTER — Ambulatory Visit: Admitting: Hematology and Oncology

## 2024-08-20 ENCOUNTER — Ambulatory Visit

## 2024-08-20 ENCOUNTER — Other Ambulatory Visit: Payer: Self-pay

## 2024-08-20 VITALS — BP 127/92 | HR 99 | Temp 97.7°F | Resp 18 | Ht 62.0 in | Wt 127.6 lb

## 2024-08-20 VITALS — BP 112/84 | HR 83 | Temp 98.0°F | Resp 16

## 2024-08-20 DIAGNOSIS — Z8542 Personal history of malignant neoplasm of other parts of uterus: Secondary | ICD-10-CM | POA: Diagnosis not present

## 2024-08-20 DIAGNOSIS — Z90710 Acquired absence of both cervix and uterus: Secondary | ICD-10-CM | POA: Diagnosis not present

## 2024-08-20 DIAGNOSIS — L271 Localized skin eruption due to drugs and medicaments taken internally: Secondary | ICD-10-CM | POA: Diagnosis not present

## 2024-08-20 DIAGNOSIS — D696 Thrombocytopenia, unspecified: Secondary | ICD-10-CM

## 2024-08-20 DIAGNOSIS — N939 Abnormal uterine and vaginal bleeding, unspecified: Secondary | ICD-10-CM | POA: Diagnosis not present

## 2024-08-20 DIAGNOSIS — C541 Malignant neoplasm of endometrium: Secondary | ICD-10-CM

## 2024-08-20 DIAGNOSIS — Z5112 Encounter for antineoplastic immunotherapy: Secondary | ICD-10-CM | POA: Diagnosis present

## 2024-08-20 DIAGNOSIS — C549 Malignant neoplasm of corpus uteri, unspecified: Secondary | ICD-10-CM

## 2024-08-20 DIAGNOSIS — C7911 Secondary malignant neoplasm of bladder: Secondary | ICD-10-CM | POA: Diagnosis not present

## 2024-08-20 DIAGNOSIS — C7889 Secondary malignant neoplasm of other digestive organs: Secondary | ICD-10-CM | POA: Diagnosis not present

## 2024-08-20 DIAGNOSIS — C786 Secondary malignant neoplasm of retroperitoneum and peritoneum: Secondary | ICD-10-CM | POA: Diagnosis present

## 2024-08-20 DIAGNOSIS — T451X5A Adverse effect of antineoplastic and immunosuppressive drugs, initial encounter: Secondary | ICD-10-CM | POA: Diagnosis not present

## 2024-08-20 DIAGNOSIS — E8940 Asymptomatic postprocedural ovarian failure: Secondary | ICD-10-CM | POA: Diagnosis not present

## 2024-08-20 DIAGNOSIS — K5900 Constipation, unspecified: Secondary | ICD-10-CM | POA: Diagnosis not present

## 2024-08-20 DIAGNOSIS — C787 Secondary malignant neoplasm of liver and intrahepatic bile duct: Secondary | ICD-10-CM | POA: Diagnosis not present

## 2024-08-20 DIAGNOSIS — N952 Postmenopausal atrophic vaginitis: Secondary | ICD-10-CM

## 2024-08-20 DIAGNOSIS — Z79620 Long term (current) use of immunosuppressive biologic: Secondary | ICD-10-CM | POA: Diagnosis not present

## 2024-08-20 DIAGNOSIS — R309 Painful micturition, unspecified: Secondary | ICD-10-CM | POA: Diagnosis not present

## 2024-08-20 DIAGNOSIS — Z9221 Personal history of antineoplastic chemotherapy: Secondary | ICD-10-CM | POA: Diagnosis not present

## 2024-08-20 DIAGNOSIS — R300 Dysuria: Secondary | ICD-10-CM | POA: Diagnosis not present

## 2024-08-20 DIAGNOSIS — R232 Flushing: Secondary | ICD-10-CM | POA: Diagnosis not present

## 2024-08-20 LAB — URINALYSIS, COMPLETE (UACMP) WITH MICROSCOPIC
Bilirubin Urine: NEGATIVE
Glucose, UA: NEGATIVE mg/dL
Ketones, ur: NEGATIVE mg/dL
Nitrite: NEGATIVE
Protein, ur: 30 mg/dL — AB
Specific Gravity, Urine: 1.028 (ref 1.005–1.030)
WBC, UA: >50 WBC/hpf (ref 0–5)
pH: 5 (ref 5.0–8.0)

## 2024-08-20 LAB — TSH: TSH: 6.9 u[IU]/mL — ABNORMAL HIGH (ref 0.350–4.500)

## 2024-08-20 MED ORDER — SODIUM CHLORIDE 0.9% FLUSH
10.0000 mL | INTRAVENOUS | Status: DC | PRN
  Administered 2024-08-20: 10 mL

## 2024-08-20 MED ORDER — SODIUM CHLORIDE 0.9 % IV SOLN
INTRAVENOUS | Status: DC
Start: 2024-08-20 — End: 2024-08-20

## 2024-08-20 MED ORDER — SODIUM CHLORIDE 0.9 % IV SOLN
200.0000 mg | Freq: Once | INTRAVENOUS | Status: AC
  Administered 2024-08-20: 200 mg via INTRAVENOUS
  Filled 2024-08-20: qty 200

## 2024-08-20 MED ORDER — ESTRADIOL 0.1 MG/GM VA CREA: 1.0000 | TOPICAL_CREAM | Freq: Every day | VAGINAL | 12 refills | Status: DC

## 2024-08-20 NOTE — Progress Notes (Signed)
 I met pt at the end of the visit with CSW intern.  I reviewed patient visit with social work Tax inspector. I concur with the treatment plan as documented in the SW intern's note.   Lexia Vandevender E Shenita Trego, LCSW Clinical Child psychotherapist

## 2024-08-20 NOTE — Telephone Encounter (Signed)
Called & left message for pt to return call to let us know how she is doing post treatment.

## 2024-08-20 NOTE — Patient Instructions (Signed)
 [facility] - A DEPT OF [facility]  Discharge Instructions: Thank you for choosing [facility] to provide your oncology and hematology care.   If you have a lab appointment with the Cancer Center, please go directly to the Cancer Center and check in at the registration area.   Wear comfortable clothing and clothing appropriate for easy access to any Portacath or PICC line.   We strive to give you quality time with your provider. You may need to reschedule your appointment if you arrive late (15 or more minutes).  Arriving late affects you and other patients whose appointments are after yours.  Also, if you miss three or more appointments without notifying the office, you may be dismissed from the clinic at the provider's discretion.      For prescription refill requests, have your pharmacy contact our office and allow 72 hours for refills to be completed.    Today you received the following chemotherapy and/or immunotherapy agents Pembrolizumab Rande Lawman)   To help prevent nausea and vomiting after your treatment, we encourage you to take your nausea medication as directed.  BELOW ARE SYMPTOMS THAT SHOULD BE REPORTED IMMEDIATELY: *FEVER GREATER THAN 100.4 F (38 C) OR HIGHER *CHILLS OR SWEATING *NAUSEA AND VOMITING THAT IS NOT CONTROLLED WITH YOUR NAUSEA MEDICATION *UNUSUAL SHORTNESS OF BREATH *UNUSUAL BRUISING OR BLEEDING *URINARY PROBLEMS (pain or burning when urinating, or frequent urination) *BOWEL PROBLEMS (unusual diarrhea, constipation, pain near the anus) TENDERNESS IN MOUTH AND THROAT WITH OR WITHOUT PRESENCE OF ULCERS (sore throat, sores in mouth, or a toothache) UNUSUAL RASH, SWELLING OR PAIN  UNUSUAL VAGINAL DISCHARGE OR ITCHING   Items with * indicate a potential emergency and should be followed up as soon as possible or go to the Emergency Department if any problems should occur.  Please show the CHEMOTHERAPY ALERT CARD or  IMMUNOTHERAPY ALERT CARD at check-in to the Emergency Department and triage nurse.  Should you have questions after your visit or need to cancel or reschedule your appointment, please contact [facility] - A DEPT OF [facility]  Dept: [phone]  and follow the prompts.  Office hours are 8:00 a.m. to 4:30 p.m. Monday - Friday. Please note that voicemails left after 4:00 p.m. may not be returned until the following business day.  We are closed weekends and major holidays. You have access to a nurse at all times for urgent questions. Please call the main number to the clinic Dept: [phone] and follow the prompts.   For any non-urgent questions, you may also contact your provider using MyChart. We now offer e-Visits for anyone 20 and older to request care online for non-urgent symptoms. For details visit [email or web address].   Also download the MyChart app! Go to the app store, search "MyChart", open the app, select [place], and log in with your MyChart username and password.   Pembrolizumab Injection What is this medication? PEMBROLIZUMAB (PEM broe LIZ ue mab) treats some types of cancer. It works by helping your immune system slow or stop the spread of cancer cells. It is a monoclonal antibody. This medicine may be used for other purposes; ask your health care provider or pharmacist if you have questions. COMMON BRAND NAME(S): Keytruda What should I tell my care team before I take this medication? They need to know if you have any of these conditions: Allogeneic stem cell transplant (uses someone else's stem cells) Autoimmune  diseases, such as Crohn disease, ulcerative colitis, lupus History of chest radiation Nervous system problems, such as Guillain-Barre syndrome, myasthenia gravis Organ transplant An unusual or allergic reaction to pembrolizumab, other medications, foods, dyes, or preservatives Pregnant or trying to get  pregnant Breast-feeding How should I use this medication? This medication is injected into a vein. It is given by your care team in a hospital or clinic setting. A special MedGuide will be given to you before each treatment. Be sure to read this information carefully each time. Talk to your care team about the use of this medication in children. While it may be prescribed for children as young as 6 months for selected conditions, precautions do apply. Overdosage: If you think you have taken too much of this medicine contact a poison control center or emergency room at once. NOTE: This medicine is only for you. Do not share this medicine with others. What if I miss a dose? Keep appointments for follow-up doses. It is important not to miss your dose. Call your care team if you are unable to keep an appointment. What may interact with this medication? Interactions have not been studied. This list may not describe all possible interactions. Give your health care provider a list of all the medicines, herbs, non-prescription drugs, or dietary supplements you use. Also tell them if you smoke, drink alcohol, or use illegal drugs. Some items may interact with your medicine. What should I watch for while using this medication? Your condition will be monitored carefully while you are receiving this medication. You may need blood work while taking this medication. This medication may cause serious skin reactions. They can happen weeks to months after starting the medication. Contact your care team right away if you notice fevers or flu-like symptoms with a rash. The rash may be red or purple and then turn into blisters or peeling of the skin. You may also notice a red rash with swelling of the face, lips, or lymph nodes in your neck or under your arms. Tell your care team right away if you have any change in your eyesight. Talk to your care team if you may be pregnant. Serious birth defects can occur if you  take this medication during pregnancy and for 4 months after the last dose. You will need a negative pregnancy test before starting this medication. Contraception is recommended while taking this medication and for 4 months after the last dose. Your care team can help you find the option that works for you. Do not breastfeed while taking this medication and for 4 months after the last dose. What side effects may I notice from receiving this medication? Side effects that you should report to your care team as soon as possible: Allergic reactions--skin rash, itching, hives, swelling of the face, lips, tongue, or throat Dry cough, shortness of breath or trouble breathing Eye pain, redness, irritation, or discharge with blurry or decreased vision Heart muscle inflammation--unusual weakness or fatigue, shortness of breath, chest pain, fast or irregular heartbeat, dizziness, swelling of the ankles, feet, or hands Hormone gland problems--headache, sensitivity to light, unusual weakness or fatigue, dizziness, fast or irregular heartbeat, increased sensitivity to cold or heat, excessive sweating, constipation, hair loss, increased thirst or amount of urine, tremors or shaking, irritability Infusion reactions--chest pain, shortness of breath or trouble breathing, feeling faint or lightheaded Kidney injury (glomerulonephritis)--decrease in the amount of urine, red or dark brown urine, foamy or bubbly urine, swelling of the ankles, hands, or feet Liver  injury--right upper belly pain, loss of appetite, nausea, light-colored stool, dark yellow or brown urine, yellowing skin or eyes, unusual weakness or fatigue Pain, tingling, or numbness in the hands or feet, muscle weakness, change in vision, confusion or trouble speaking, loss of balance or coordination, trouble walking, seizures Rash, fever, and swollen lymph nodes Redness, blistering, peeling, or loosening of the skin, including inside the mouth Sudden or  severe stomach pain, bloody diarrhea, fever, nausea, vomiting Side effects that usually do not require medical attention (report to your care team if they continue or are bothersome): Bone, joint, or muscle pain Diarrhea Fatigue Loss of appetite Nausea Skin rash This list may not describe all possible side effects. Call your doctor for medical advice about side effects. You may report side effects to FDA at 1-800-FDA-1088. Where should I keep my medication? This medication is given in a hospital or clinic. It will not be stored at home. NOTE: This sheet is a summary. It may not cover all possible information. If you have questions about this medicine, talk to your doctor, pharmacist, or health care provider.  2024 Elsevier/Gold Standard (2022-04-16 00:00:00)

## 2024-08-20 NOTE — Assessment & Plan Note (Addendum)
 She has significant urinary discomfort with signs and symptoms of atrophic vaginitis We discussed risk and benefits of using topical vaginal estrogen cream and she is in agreement I will order urinalysis and urine culture today to rule out urinary tract infection

## 2024-08-20 NOTE — Progress Notes (Signed)
 [facility] OFFICE PROGRESS NOTE  Patient Care Team: Nedra Tinnie LABOR, NP as PCP - General (Internal Medicine) Lonn Hicks, MD as Consulting Physician (Hematology and Oncology)  Assessment & Plan Malignant neoplasm of body of uterus, unspecified site [facility]) The patient was originally diagnosed with uterine cancer in 2020 and subsequently lost to follow-up She was subsequently evaluated and treated in [place] , records were reviewed and summarized in the oncologic history.  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 p53 stain shows mutant pattern staining.  MSI stable, MMR intact, foundation One: Low tumor mutation burden, HRD negative.  Genetic testing from January 2025 was negative  She received first cycle of combination of pembrolizumab  and lenvatinib  on July 30, 2024 which she tolerated well I reviewed multiple CT imaging with the patient which shows stable disease control She is able to get lenvatinib  refill Will proceed with treatment without delay today She has appointment to see GYN surgeon in 2 weeks for pelvic exam Atrophic vaginitis She has significant urinary discomfort with signs and symptoms of atrophic vaginitis We discussed risk and benefits of using topical vaginal estrogen cream and she is in agreement I will order urinalysis and urine culture today to rule out urinary tract infection Thrombocytopenia (HCC) This is likely due to recent treatment. The patient denies recent history of bleeding such as epistaxis, hematuria or hematochezia. She is asymptomatic from the  low platelet count. I will observe for now.    No orders of the defined types were placed in this encounter.    Hicks Lonn, MD  INTERVAL HISTORY: she returns for treatment follow-up Complications related to previous cycle of chemotherapy included thrombocytopenia, and recent hospitalization/ ER visit, She had deep abdominal pain when she went to the emergency room but her pain resolved She has urinary discomfort when she urinates  PHYSICAL EXAMINATION: ECOG PERFORMANCE STATUS: 1 - Symptomatic but completely ambulatory  No results found for: CAN125    Latest Ref Rng & Units 08/17/2024    7:18 AM  CBC  WBC 4.0 - 10.5 K/uL 4.5   Hemoglobin 12.0 - 15.0 g/dL 86.5   Hematocrit 63.9 - 46.0 % 39.4   Platelets 150 - 400 K/uL 109       Chemistry      Component Value Date/Time   NA 140 08/17/2024 0718   K 3.8 08/17/2024 0718   CL 102 08/17/2024 0718   CO2 24 08/17/2024 0718   BUN 9 08/17/2024 0718   CREATININE 0.73 08/17/2024 0718      Component Value Date/Time   CALCIUM 9.8 08/17/2024 0718   ALKPHOS 48 08/17/2024 0718   AST 27 08/17/2024 0718   ALT 23 08/17/2024 0718   BILITOT 1.1 08/17/2024 0718       Vitals:   08/20/24 1019  BP: (!) 127/92  Pulse: 99  Resp: 18  Temp: 97.7 F (36.5 C)  SpO2: 99%   Filed Weights   08/20/24 1019  Weight: 127 lb 9.6 oz (57.9 kg)   Other relevant data reviewed during this visit included CBC, CMP, CT imaging from August 2025 in comparison with CT imaging from January 2025

## 2024-08-20 NOTE — Progress Notes (Cosign Needed Addendum)
 [facility] Clinical Social Work  Initial Assessment   Christina Alvarez is a 28 y.o. year old female presenting alone. Clinical Social Work was referred by distress screen protocol for assessment of psychosocial needs.   SDOH (Social Determinants of Health) assessments performed: Yes SDOH Interventions    Flowsheet Row Clinical Support from 08/20/2024 in [facility] - A Dept Of [place] Office Visit from 01/08/2023 in [facility]  SDOH Interventions    Food Insecurity Interventions Intervention Not Indicated --  Housing Interventions Intervention Not Indicated --  Transportation Interventions Intervention Not Indicated --  Utilities Interventions Intervention Not Indicated --  Depression Interventions/Treatment  -- Patient refuses Treatment  Financial Strain Interventions Intervention Not Indicated --    SDOH Screenings   Food Insecurity: No Food Insecurity (08/20/2024)  Housing: Low Risk  (08/20/2024)  Transportation Needs: No Transportation Needs (08/20/2024)  Utilities: Not At Risk (08/20/2024)  Depression (PHQ2-9): Medium Risk (01/08/2023)  Financial Resource Strain: Low Risk  (08/20/2024)  Social Connections: Unknown (06/17/2022)   Received from [facility]  Tobacco Use: Low Risk  (08/20/2024)    PHQ 2/9:    01/08/2023    1:32 PM 12/26/2021    2:03 PM  Depression screen PHQ 2/9  Decreased Interest 2 0  Down, Depressed, Hopeless 2 0  PHQ - 2 Score 4 0  Altered sleeping 2   Tired, decreased energy 2   Change in appetite 0   Feeling bad or failure about yourself  0   Trouble concentrating 2   Moving slowly or fidgety/restless 0   Suicidal thoughts 0   PHQ-9 Score 10   Difficult doing work/chores Somewhat difficult      Distress Screen completed: No    08/17/2024    5:44 PM  ONCBCN DISTRESS SCREENING  Screening Type Initial Screening  How much distress have you been experiencing in the past week? (0-10) 6  Social  concerns type Relationship with friends or coworkers;Ability to have children  Emotional concerns type Worry or anxiety;Grief or loss;Changes in appearance  Spiritual/Religous concerns type Death, dying, or afterlife  Physical Concerns Type  Sleep;Memory or concentration;Changes in eating;Loss or change of physical abilities      Family/Social Information:  Housing Arrangement: patient lives alone Family members/support persons in your life? Family and Friends Transportation concerns: no  Employment: Working full time in [employer] and [employer].  Income source: Employment Financial concerns: No Type of concern: None Food access concerns: no Religious or spiritual practice: Yes-Just moved from [facility] where she was attending church. Pt is looking for a local church to attend in [place]. Advanced directives: No Services Currently in place:  [employer]  Coping/ Adjustment to diagnosis: Patient understands treatment plan and what happens next? yes Concerns about diagnosis and/or treatment: Body image and general grief and anxiety related to Dx. Patient reported stressors: Anxiety/ nervousness, Adjusting to my illness, Facing my mortality, and not currently able to exercise during recovery from surgery which has been an important coping strategy. Patient enjoys exercise and especially yoga. Not currently practicing post-op. Current coping skills/ strengths: Capable of independent living , Communication skills , Contractor , Religious Affiliation , Supportive family/friends , and Work skills     SUMMARY:   Clinical Social Work Clinical Goal(s):  No clinical social work goals at this time  Interventions: Discussed common feeling and emotions when being diagnosed with cancer, and the importance of  support during treatment Informed patient of the support team roles and support services at [facility] Provided CSW contact information and encouraged patient to call with any  questions or concerns   Follow Up Plan: Patient will contact CSW with any support or resource needs Patient verbalizes understanding of plan: Yes    Thersia KATHEE Daring Clinical Social Worker [facility]

## 2024-08-20 NOTE — Assessment & Plan Note (Addendum)
This is likely due to recent treatment. The patient denies recent history of bleeding such as epistaxis, hematuria or hematochezia. She is asymptomatic from the low platelet count. I will observe for now.  

## 2024-08-20 NOTE — Assessment & Plan Note (Addendum)
 The patient was originally diagnosed with uterine cancer in 2020 and subsequently lost to follow-up She was subsequently evaluated and treated in [place] , records were reviewed and summarized in the oncologic history.  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 p53 stain shows mutant pattern staining.  MSI stable, MMR intact, foundation One: Low tumor mutation burden, HRD negative.  Genetic testing from January 2025 was negative  She received first cycle of combination of pembrolizumab  and lenvatinib  on July 30, 2024 which she tolerated well I reviewed multiple CT imaging with the patient which shows stable disease control She is able to get lenvatinib  refill Will proceed with treatment without delay today She has appointment to see GYN surgeon in 2 weeks for pelvic exam

## 2024-08-20 NOTE — Telephone Encounter (Signed)
-----   Message from Nurse Almarie A sent at 08/20/2024 11:13 AM EDT ----- Regarding: First time 08/20/24- First time Keytruda . Tolerated well. Dr. Lonn patient. Past history of chemo in [place] .

## 2024-08-21 LAB — URINE CULTURE: Culture: 20000 — AB

## 2024-08-21 LAB — CA 125: Cancer Antigen (CA) 125: 4.3 U/mL (ref 0.0–38.1)

## 2024-08-26 ENCOUNTER — Encounter: Payer: Self-pay | Admitting: Hematology and Oncology

## 2024-08-26 ENCOUNTER — Telehealth: Payer: Self-pay

## 2024-08-26 ENCOUNTER — Other Ambulatory Visit: Payer: Self-pay | Admitting: Pharmacist

## 2024-08-26 ENCOUNTER — Inpatient Hospital Stay (HOSPITAL_BASED_OUTPATIENT_CLINIC_OR_DEPARTMENT_OTHER): Admitting: Hematology and Oncology

## 2024-08-26 VITALS — BP 130/98 | HR 95 | Temp 99.5°F | Resp 18 | Ht 62.0 in | Wt 130.0 lb

## 2024-08-26 DIAGNOSIS — C549 Malignant neoplasm of corpus uteri, unspecified: Secondary | ICD-10-CM

## 2024-08-26 DIAGNOSIS — L271 Localized skin eruption due to drugs and medicaments taken internally: Secondary | ICD-10-CM | POA: Diagnosis not present

## 2024-08-26 DIAGNOSIS — Z5112 Encounter for antineoplastic immunotherapy: Secondary | ICD-10-CM | POA: Diagnosis not present

## 2024-08-26 MED ORDER — LENVATINIB (10 MG DAILY DOSE) 10 MG PO CPPK: 10.0000 mg | ORAL_CAPSULE | Freq: Every day | ORAL | 11 refills | Status: AC

## 2024-08-26 MED ORDER — LENVATINIB (10 MG DAILY DOSE) 10 MG PO CPPK: 10.0000 mg | ORAL_CAPSULE | Freq: Every day | ORAL | 11 refills | Status: DC

## 2024-08-26 NOTE — Telephone Encounter (Signed)
 S/w patient regarding newly scheduled appointment for today at 3:20 PM to have Dr. Lonn evaluate patient's new onset of blisters to hands and feet.  Patient confirmed appointment time and date.

## 2024-08-26 NOTE — Progress Notes (Signed)
 [facility] OFFICE PROGRESS NOTE  Patient Care Team: Nedra Tinnie LABOR, NP as PCP - General (Internal Medicine) Lonn Hicks, MD as Consulting Physician (Hematology and Oncology)  Assessment & Plan Malignant neoplasm of body of uterus, unspecified site [facility]) The patient was originally diagnosed with uterine cancer in 2020 and subsequently lost to follow-up She was subsequently evaluated and treated in [place] , records were reviewed and summarized in the oncologic history.  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 p53 stain shows mutant pattern staining.  MSI stable, MMR intact, foundation One: Low tumor mutation burden, HRD negative.  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    Hicks Lonn, MD  INTERVAL HISTORY: she returns for urgent evaluation She developed hand-foot syndrome over the past few days Pictures were available for review over MyChart She states blisters at the bottom of the feet is painful when she tries to walk There is no fissures or significant dryness on her skin The blisters were present on both hands and feet  PHYSICAL EXAMINATION: ECOG PERFORMANCE STATUS: 1 - Symptomatic but completely ambulatory  Lab Results  Component Value Date   CAN125 4.3 08/20/2024      Latest Ref Rng & Units 08/17/2024    7:18 AM  CBC  WBC 4.0 - 10.5 K/uL 4.5   Hemoglobin 12.0 - 15.0 g/dL 86.5   Hematocrit 63.9 - 46.0 % 39.4   Platelets 150 - 400 K/uL 109       Chemistry      Component Value Date/Time   NA 140 08/17/2024 0718   K 3.8 08/17/2024 0718   CL 102 08/17/2024 0718   CO2 24 08/17/2024 0718   BUN 9 08/17/2024 0718   CREATININE 0.73 08/17/2024 0718      Component Value Date/Time   CALCIUM 9.8 08/17/2024 0718   ALKPHOS 48 08/17/2024 0718   AST 27 08/17/2024 0718   ALT 23 08/17/2024 0718   BILITOT 1.1 08/17/2024 0718       Vitals:   08/26/24 1144  BP: (!) 130/98  Pulse: 95  Resp: 18  Temp: 99.5 F (37.5 C)  SpO2: 99%   Filed Weights   08/26/24 1144  Weight: 130 lb (59 kg)   Physical examination of her hands and bottom of her feet show mild blisterlike lesions consistent with hand-foot syndrome.  No fissures, skin ulcers were noted

## 2024-08-26 NOTE — Assessment & Plan Note (Addendum)
 The patient was originally diagnosed with uterine cancer in 2020 and subsequently lost to follow-up She was subsequently evaluated and treated in [place] , records were reviewed and summarized in the oncologic history.  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 p53 stain shows mutant pattern staining.  MSI stable, MMR intact, foundation One: Low tumor mutation burden, HRD negative.  Genetic testing from January 2025 was negative  She received first cycle of combination of pembrolizumab  and lenvatinib  on July 30, 2024 which she tolerated well I reviewed multiple CT imaging with the patient which shows stable disease control She will continue Keytruda  as scheduled I plan to reduce the dose of Lenvima  as below

## 2024-08-26 NOTE — Assessment & Plan Note (Addendum)
 She has classic signs of hand-foot syndrome due to side effects of Lenvima , currently stage I toxicity I recommend the patient to hold her treatment today and resume early next week at reduced dose of 10 mg daily She is returning in of next week to see GYN surgeon and the plan to check on her at that time We discussed conservative approach of topical emollient cream For future refill, I will reduce the dose of Lenvima  to 10 mg daily

## 2024-08-29 ENCOUNTER — Other Ambulatory Visit: Payer: Self-pay

## 2024-08-31 ENCOUNTER — Encounter: Payer: Self-pay | Admitting: Hematology and Oncology

## 2024-09-01 ENCOUNTER — Encounter: Payer: Self-pay | Admitting: Gynecologic Oncology

## 2024-09-01 ENCOUNTER — Inpatient Hospital Stay

## 2024-09-01 NOTE — Progress Notes (Signed)
 [facility] CSW Progress Note  Clinical Social Worker contacted patient by phone to follow-up on any concerns since initial encounter and introduction to CSW services on 08/20/24.    Interventions: Pt expressed no needs at this time.      Follow Up Plan:  Patient will contact CSW with any support or resource needs    Thersia KATHEE Daring Clinical Social Worker [facility]

## 2024-09-03 ENCOUNTER — Other Ambulatory Visit: Payer: Self-pay

## 2024-09-03 ENCOUNTER — Telehealth: Payer: Self-pay

## 2024-09-03 ENCOUNTER — Emergency Department (HOSPITAL_COMMUNITY)
Admission: EM | Admit: 2024-09-03 | Discharge: 2024-09-03 | Disposition: A | Discharge status: Home / Self Care | Source: Ambulatory Visit | Attending: Emergency Medicine | Admitting: Emergency Medicine

## 2024-09-03 ENCOUNTER — Encounter: Payer: Self-pay | Admitting: Gynecologic Oncology

## 2024-09-03 ENCOUNTER — Inpatient Hospital Stay (HOSPITAL_BASED_OUTPATIENT_CLINIC_OR_DEPARTMENT_OTHER): Admitting: Gynecologic Oncology

## 2024-09-03 VITALS — BP 119/84 | HR 93 | Temp 97.9°F | Resp 18 | Wt 128.4 lb

## 2024-09-03 DIAGNOSIS — Z8542 Personal history of malignant neoplasm of other parts of uterus: Secondary | ICD-10-CM

## 2024-09-03 DIAGNOSIS — C541 Malignant neoplasm of endometrium: Secondary | ICD-10-CM

## 2024-09-03 DIAGNOSIS — T8140XA Infection following a procedure, unspecified, initial encounter: Secondary | ICD-10-CM | POA: Diagnosis not present

## 2024-09-03 DIAGNOSIS — T80212A Local infection due to central venous catheter, initial encounter: Secondary | ICD-10-CM | POA: Insufficient documentation

## 2024-09-03 DIAGNOSIS — E8940 Asymptomatic postprocedural ovarian failure: Secondary | ICD-10-CM | POA: Diagnosis not present

## 2024-09-03 DIAGNOSIS — C786 Secondary malignant neoplasm of retroperitoneum and peritoneum: Secondary | ICD-10-CM

## 2024-09-03 LAB — CBC WITH DIFFERENTIAL/PLATELET
Abs Immature Granulocytes: 0.01 K/uL (ref 0.00–0.07)
Basophils Absolute: 0 K/uL (ref 0.0–0.1)
Basophils Relative: 0 %
Eosinophils Absolute: 0.1 K/uL (ref 0.0–0.5)
Eosinophils Relative: 1 %
HCT: 41.9 % (ref 36.0–46.0)
Hemoglobin: 13.3 g/dL (ref 12.0–15.0)
Immature Granulocytes: 0 %
Lymphocytes Relative: 43 %
Lymphs Abs: 2.1 K/uL (ref 0.7–4.0)
MCH: 28.5 pg (ref 26.0–34.0)
MCHC: 31.7 g/dL (ref 30.0–36.0)
MCV: 89.7 fL (ref 80.0–100.0)
Monocytes Absolute: 0.6 K/uL (ref 0.1–1.0)
Monocytes Relative: 12 %
Neutro Abs: 2 K/uL (ref 1.7–7.7)
Neutrophils Relative %: 44 %
Platelets: 154 K/uL (ref 150–400)
RBC: 4.67 MIL/uL (ref 3.87–5.11)
RDW: 16.8 % — ABNORMAL HIGH (ref 11.5–15.5)
WBC: 4.8 K/uL (ref 4.0–10.5)
nRBC: 0 % (ref 0.0–0.2)

## 2024-09-03 LAB — URINALYSIS, W/ REFLEX TO CULTURE (INFECTION SUSPECTED)
Bacteria, UA: NONE SEEN
Bilirubin Urine: NEGATIVE
Glucose, UA: NEGATIVE mg/dL
Hgb urine dipstick: NEGATIVE
Ketones, ur: NEGATIVE mg/dL
Nitrite: NEGATIVE
Protein, ur: NEGATIVE mg/dL
Specific Gravity, Urine: 1.018 (ref 1.005–1.030)
pH: 5 (ref 5.0–8.0)

## 2024-09-03 LAB — COMPREHENSIVE METABOLIC PANEL WITH GFR
ALT: 21 U/L (ref 0–44)
AST: 28 U/L (ref 15–41)
Albumin: 4.7 g/dL (ref 3.5–5.0)
Alkaline Phosphatase: 49 U/L (ref 38–126)
Anion gap: 12 (ref 5–15)
BUN: 10 mg/dL (ref 6–20)
CO2: 27 mmol/L (ref 22–32)
Calcium: 9.7 mg/dL (ref 8.9–10.3)
Chloride: 102 mmol/L (ref 98–111)
Creatinine, Ser: 0.83 mg/dL (ref 0.44–1.00)
GFR, Estimated: >60 mL/min (ref >60)
Glucose, Bld: 85 mg/dL (ref 70–99)
Potassium: 3.6 mmol/L (ref 3.5–5.1)
Sodium: 141 mmol/L (ref 135–145)
Total Bilirubin: 1.2 mg/dL (ref 0.0–1.2)
Total Protein: 7.8 g/dL (ref 6.5–8.1)

## 2024-09-03 LAB — I-STAT CG4 LACTIC ACID, ED: Lactic Acid, Venous: 1.5 mmol/L (ref 0.5–1.9)

## 2024-09-03 MED ORDER — DOXYCYCLINE HYCLATE 100 MG PO TABS
100.0000 mg | ORAL_TABLET | Freq: Once | ORAL | Status: AC
  Administered 2024-09-03: 100 mg via ORAL
  Filled 2024-09-03: qty 1

## 2024-09-03 MED ORDER — DOXYCYCLINE HYCLATE 100 MG PO CAPS: 100.0000 mg | ORAL_CAPSULE | Freq: Two times a day (BID) | ORAL | 0 refills | Status: DC

## 2024-09-03 NOTE — Patient Instructions (Signed)
 It was great to see you.  Please keep me posted if you'd like to try something for hot flashes.  If bladder symptoms don't improve, you could try course of over the counter Azo.  I will see you back in 3 months.

## 2024-09-03 NOTE — Progress Notes (Addendum)
 Gynecologic Oncology Return Clinic Visit  09/03/24  Reason for Visit: follow-up  Treatment History: Oncology History Overview Note  Original path: adenocarcinoma, IHC MMR intact ER +, PR +, patchy p16 +, HR HPV negative  Negative [facility] CancerNext+RNAinsight Panel.  Report date is 01/12/2024.   The [facility] CancerNext+RNAinsight Panel includes sequencing, rearrangement analysis, and RNA analysis for the following 39 genes: APC, ATM, BAP1, BARD1, BMPR1A, BRCA1, BRCA2, BRIP1, CDH1, CDKN2A, CHEK2, FH, FLCN, MET, MLH1, MSH2, MSH6, MUTYH, NF1, NTHL1, PALB2, PMS2, PTEN, RAD51C, RAD51D, SMAD4, STK11, TP53, TSC1, TSC2, and VHL (sequencing and deletion/duplication); AXIN2, HOXB13, MBD4, MSH3, POLD1 and POLE (sequencing only); EPCAM and GREM1 (deletion/duplication only).   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  Bilateral ovaries with polycystic ovary appearance.   10/09/2019 Initial Biopsy   EMB: CAH, suspicious for [facility]   10/15/2019 Pathology Results   EMB: Grade 1 EMCA   11/02/2019 Initial Diagnosis   Malignant neoplasm of endometrium (HCC)   11/09/2019 Imaging   MRI pelvis: no evidence of myometrial invasion   11/22/2019 Surgery   D&C, [place]mar IUD placement Pathology: focal residual EMCA with treatment effect   06/08/2020 Surgery   D&C, Mirena IUD replacement Pathology: scant pieces of benign endometrium with treatment effect   06/29/2021 Pathology Results   EMB: Polypoid fragments of inactive endometrium with hormone effect.  - Benign squamous cells.  - No hyperplasia or malignancy.   01/12/2024 Genetic Testing   Negative [facility] CancerNext+RNAinsight  Panel.  Report date is 01/12/2024.   The [facility] CancerNext+RNAinsight Panel includes sequencing, rearrangement analysis, and RNA analysis for the following 39 genes: APC, ATM, BAP1, BARD1, BMPR1A, BRCA1, BRCA2, BRIP1, CDH1, CDKN2A, CHEK2, FH, FLCN, MET, MLH1, MSH2, MSH6, MUTYH, NF1, NTHL1, PALB2, PMS2, PTEN, RAD51C, RAD51D, SMAD4, STK11, TP53, TSC1, TSC2, and VHL (sequencing and deletion/duplication); AXIN2, HOXB13, MBD4, MSH3, POLD1 and POLE (sequencing only); EPCAM and GREM1 (deletion/duplication only).    01/16/2024 - 05/07/2024 Chemotherapy   She received 7 cycles of neoadjuvant chemotherapy with carboplatin, paclitaxel and dostarlimab   01/28/2024 Imaging   1.  No evidence of new primary or metastatic neoplasia in the chest   2.  Soft tissue scalloping along the liver border incompletely imaged in the  upper abdomen, consider follow-up abdomen/pelvis CT to further interrogate if  deemed clinically appropriate to assess for evidence of peritoneal tumor  implants    03/21/2024 Imaging   1.  Peritoneal disease in the abdomen and pelvis as described with multiple  discrete and confluent peritoneal deposits in the perihepatic region and in  the pelvis as described  2.  Masslike density in the right adnexa not seen separately from the right  ovary could represent a primary malignancy or a metastatic deposit. Discrete  and confluent peritoneal deposits in the pelvis and superior to the bladder  with trace pelvic free fluid  3.  Slightly prominent noticeable mesenteric and retroperitoneal nodes with  prominent to enlarged periportal lymph nodes which are indeterminate and could be metastatic. Continued follow-up is recommended.  4.  Hypodensity in the inferior right hepatic lobe is indeterminate but  concerning for a metastatic lesion  5.  Heterogeneous uterus with intrauterine device in place. Prominent adnexal  vascularity  6. No aggressive osseous lesions    03/21/2024 Imaging   1.  Slight  interval increase in size of small cardiophrenic and prevascular  lymph node, which are indeterminate. Attention on follow-up exam is  recommended.  2. No suspicious pulmonary nodules.    03/30/2024 Pathology Results   Pathologic Diagnosis   A. Perihepatic lesion, biopsy: Metastatic carcinoma, consistent with a mullerian origin   Comment: The patient has a known history of endometrioid adenocarcinoma, FIGO grade 1 ([id number]). Immunohistochemical staining of block A2 reveals tumor cells positive for CK7, SOX17, and ER, and negative for CK20 and PAX8. 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     03/30/2024 Pathology Results   PROCEDURE PERFORMED:  Ultrasound guided needle biopsy.   FINDINGS/TARGET:  Perihepatic liver lesion    06/01/2024 Imaging   1.  Worsening peritoneal metastasis with increase in size and number of  confluent peritoneal deposits in the perihepatic region and pelvis. Trace free  pelvic fluid.  2.  Increase in size of masslike density in the right adnexa which is not seen  separately from the right ovary likely relates to metastatic involvement.  3.  Unchanged slightly prominent mesenteric and retroperitoneal lymph nodes  with enlarged periportal lymph nodes are indeterminate. Continued follow-up is  recommended  4. Indeterminate hypodensity in the inferior right hepatic lobe is also  stable in size.  5.  Heterogeneous uterus with intrauterine device in place. Prominent adnexal  vascularity.  6. No aggressive osseous lesions..     06/22/2024 Surgery   Date of Surgery: 06/22/2024   Pre-operative Diagnosis:  Endometrial cancer [C54.1]  Post-operative Diagnosis:  Endometrial cancer [C54.1]  Procedure:  Partial right liver (segments 5-8) resection Partial right liver (segment 6) resection Partial left liver (segment 2) resection Partial left liver (segment 1)  resection Right liver wedge excision (segment 6) Partial right diaphragm resection Partial splenectomy Intraoperative liver ultrasound Right thoracostomy chest tube placement Resection of porta hepatis tumors <8cm Cholecystectomy  Surgeon:  Dr [place] Cloyd   Assistant:  Dr Alfonso Galeazzi Dr Therisa Sanders Morbey  Clinical Note:  Christina Alvarez is a 28 y.o. female with a history of endometrial cancer with peritoneal metastases. She received systemic chemotherapy and presents today for cytoreductive surgery with Dr Lansing.   Findings: Extensive perihepatic disease which required complete mobilization of the liver, partial right diaphragm resection, and partial hepatectomy of superficial liver of at least 50% of the liver surface. All visualized disease resected.    06/22/2024 Pathology Results   A. Omentum, omentectomy: Metastatic adenocarcinoma, consistent with a mullerian origin  B. Perigastric nodule, excision: Metastatic adenocarcinoma, consistent with a mullerian origin  C. Peritoneal nodule, excision: Metastatic adenocarcinoma, consistent with a mullerian origin  D. Appendix, appendectomy: Metastatic adenocarcinoma, consistent with a mullerian origin  E. Pelvic nodule, excision: Metastatic adenocarcinoma, consistent with a mullerian origin  F. Right tube and ovary, salpingo-oophorectomy: Ovary and fallopian tube with deposits of metastatic adenocarcinoma, consistent with a mullerian origin Lymphovascular invasion identified  G. 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 Bladder  peritoneum, biopsy: Metastatic adenocarcinoma, consistent with a mullerian origin  I. Perirectal nodule, excision: Metastatic adenocarcinoma, consistent with a mullerian origin  J. Sigmoid mesentery mass, excision: Metastatic adenocarcinoma, consistent with a mullerian origin  K. Posterior cul-de-sac, biopsy: Metastatic adenocarcinoma, consistent with a mullerian origin  L. Right aortic lymph nodes, lymphadenectomy: 3 lymph nodes, negative for carcinoma   M. Anterior peritoneal nodule, excision: Metastatic adenocarcinoma, consistent with a mullerian origin  N. Round ligament nodule, excision: Metastatic adenocarcinoma, consistent with a mullerian origin  O. Caudate process tumors, excision: Metastatic adenocarcinoma, consistent with a mullerian origin  P. Gallbladder, cholecystectomy: Gallbladder with no significant pathologic change, negative for carcinoma  Q. Cystic lymph node, lymphadenectomy: 1 lymph node, negative for carcinoma   R. Caudate implant tumor, excision: Metastatic adenocarcinoma, consistent with a mullerian origin  S. Porta hepatis tumors, excision: Metastatic adenocarcinoma, consistent with a mullerian origin  T. Fissure tumor, excision: Metastatic adenocarcinoma, consistent with a mullerian origin  U. Liver, segment 2, partial hepatectomy: Metastatic adenocarcinoma, consistent with a mullerian origin  V. Right liver, partial hepatectomy: Metastatic adenocarcinoma, consistent with a mullerian origin  W. Central diaphragm implants, excision: Metastatic adenocarcinoma, consistent with a mullerian origin  X. Splenic nodule, excision: Metastatic adenocarcinoma, consistent with a mullerian origin   The case was reviewed at GYN pathology meeting, and the above diagnosis reflects our division consensus.  The endometrial tumor is classified as FIGO grade 2 endometrioid  adenocarcinoma. Multiple extrauterine sites, including the uterine serosa, adnexa, peritoneum, omentum, gastrointestinal mesentery, porta hepatis, diaphragm, spleen, and liver, contain metastatic deposits of mllerian adenocarcinoma. Metastatic deposits exhibit higher-grade morphology; however, molecular profiling demonstrates a consistent p53 mutant pattern across all sites. These findings suggest a diagnosis of primary endometrial origin with widespread metastatic spread.  Immunohistochemical stains performed on block A2 (omentum) show positive staining for AE1/AE3, EMA, SOX17, ER (40%, moderate intensity) while negative staining for PAX8. p53 stain shows mutant pattern staining.  Immunohistochemical stains performed on block G6 (endometrium) shows positive staining staining for ER (40%, moderate intensity) , PR (90%, strong intensity). p53 stain shows mutant pattern staining.  Mismatch Repair Protein (MMR) Nuclear Expression by IHC on block G8; serosal nodule: (present/intact) MLH1: Present PMS2: Present MSH2: Present MSH6: Present  IHC Interpretation: No loss of nuclear expression of MMR proteins: low probability of microsatellite instability-high (MSI-H)#  # There are exceptions to the above IHC interpretations. These results should not be considered in isolation, and clinical correlation with genetic counseling is recommended to assess the need for germline testing.     07/19/2024 3:53 PM EDT MICHAIL [facility]    Addendum On July 16, 2024, Dr. Edsel Host submitted an order for additional stains/tests HER2 IHC Quant and the tissue block(s) was retrieved from archive storage and submitted to the [facility] clinical histology laboratory for additional processing and staining.  HER2 Immunohistochemistry:  Result: Negative (Score 1+)  All controls show appropriate reactivity.  HER2 protein expression is evaluated using DESTINY-PanTumor02 trial ([id number])  enrollment criteria for Trastuzumab-Deruxtecan use. It is evaluated by manual quantitative immunohistochemistry on formalin-fixed, paraffin-embedded tissues, using clone [id number] (rabbit monoclonal, Ventana) on a Ventana auto-stainer. Membrane staining of tumor cells is evaluated and graded as follows:  Result Criteria for Surgical Specimens Criteria for Biopsy Specimens Negative (Score 0) No staining or membrane staining in less than 10% of tumor cells No staining in any tumor cells Negative (Score 1+) Faint/barely perceptible incomplete membrane staining in greater than or equal to 10% tumor cells Tumor cell cluster*  with a faint/barely perceptible membrane staining irrespective of percentage of positive tumor cells Equivocal (Score 2+) Weak to moderate, complete, basolateral or lateral membrane staining in greater than or equal to10% of tumor cells Tumor cell cluster* with a weak to moderate, complete, basolateral or lateral membrane staining irrespective of percentage of positive tumor cells Positive (Score 3+) Strong, complete, basolateral or lateral membrane staining in greater than or equal to 10% of tumor cells Tumor cell cluster* with a strong, complete, basolateral or lateral membrane staining irrespective of percentage of positive tumor cells         06/23/2024 Imaging   1.  No pulmonary embolus.  2.  Small pleural effusions with mild adjacent atelectasis.  3.  Trace anterior right pneumothorax, a chest tube is in place.  4.  Majority of mediastinal lymph nodes are stable, while there are a few that  appear increased in size, potentially reactive although indeterminate in the  setting of malignancy. Attention on follow-up imaging.  5.  Partially imaged postoperative changes in the upper abdomen.    07/30/2024 -  Chemotherapy   Patient is on Treatment Plan : UTERINE Lenvatinib  (20) D1-21 + Pembrolizumab  (200) D1 q21d     08/06/2024 Cancer Staging   Staging form: Corpus Uteri - Carcinoma and  Carcinosarcoma, AJCC 8th Edition and FIGO 2023 - Pathologic: FIGO Stage IVB (pT4, pN0, pM1) - Signed by Lonn Hicks, MD on 08/06/2024 Stage prefix: Initial diagnosis   08/13/2024 Imaging   CT CHEST ABDOMEN PELVIS W CONTRAST Result Date: 08/16/2024 CLINICAL DATA:  Metastatic uterine cancer, assess treatment response. * Tracking Code: BO * EXAM: CT CHEST, ABDOMEN, AND PELVIS WITH CONTRAST TECHNIQUE: Multidetector CT imaging of the chest, abdomen and pelvis was performed following the standard protocol during bolus administration of intravenous contrast. RADIATION DOSE REDUCTION: This exam was performed according to the departmental dose-optimization program which includes automated exposure control, adjustment of the mA and/or kV according to patient size and/or use of iterative reconstruction technique. CONTRAST:  OMNIPAQUE  IOHEXOL  300 MG/ML  SOLN COMPARISON:  CT January 06, 2024 FINDINGS: CT CHEST FINDINGS Cardiovascular: Right chest wall Port-A-Cath with gas in the subcutaneous tissues along the catheter tract and port site compatible with sequela of recent insertion. Normal caliber thoracic aorta. Normal size heart. No significant pericardial effusion/thickening. Mediastinum/Nodes: No suspicious thyroid nodule. Feathery soft tissue in the anterior mediastinum which is crescentic and conforms to underlying vasculature. No suspicious thyroid nodule. Increased size of a right pericardiophrenic lymph node now measuring 8 mm in short axis on image 41/2 previously 4 mm. Lungs/Pleura: No suspicious pulmonary nodules or masses. No pleural effusion. No pneumothorax. Musculoskeletal: No suspicious chest wall mass. No aggressive lytic or blastic lesion of bone. CT ABDOMEN PELVIS FINDINGS Hepatobiliary: Decreased size of the peritoneal metastasis along the hepatic capsule for instance an implant along the anterolateral aspect of the liver measures 12 x 5 mm on image 53/2 previously 2.8 x 1.2 cm. Ill-defined segment V  hepatic hypodensity measuring 4 mm on image 62/2 is unchanged. Gallbladder is nondistended or surgically absent. No biliary ductal dilation. Pancreas: No pancreatic ductal dilation or evidence of acute inflammation. Spleen: No splenomegaly. Adrenals/Urinary Tract: No suspicious adrenal nodule/mass. Kidneys demonstrate symmetric enhancement. Urinary bladder is unremarkable for degree of distension. Stomach/Bowel: Stomach is minimally distended. No pathologic dilation of small or large bowel. Vascular/Lymphatic: Normal caliber abdominal aorta. Smooth IVC contours. No pathologically enlarged abdominal or pelvic lymph nodes. Reproductive: Uterus is surgically absent. Diffuse thickening of the vagina. No  asymmetric nodular enhancement along the vaginal cuff. Other: Trace pelvic free fluid. Probable prior omentectomy. The previously identified peritoneal/omental implants in the pelvis in anterior abdominal wall are not confidently identified although evaluation is made difficult by the lack of intraluminal enteric contrast material. Musculoskeletal: No aggressive lytic or blastic lesion of bone. IMPRESSION: 1. Decreased size of the peritoneal metastasis along the hepatic capsule. 2. The previously identified peritoneal/omental implants in the pelvis and anterior abdominal wall are not confidently identified although evaluation is made difficult by the lack of intraluminal enteric contrast material. Consider follow-up surveillance/restaging imaging with oral and IV contrast medium. 3. Increased size of a right pericardiophrenic lymph node now measuring 8 mm in short axis previously 4 mm, nonspecific suggest attention on follow-up imaging. 4. Feathery soft tissue in the anterior mediastinum which is crescentic and conforms to underlying vasculature, favored to reflect thymic rebound. 5. Uterus is surgically absent, diffuse thickening of the vagina, nonspecific but possibly reflecting posttreatment/postsurgical change. No  asymmetric nodular enhancement along the vaginal cuff. Suggest attention on follow-up imaging. 6. Trace pelvic free fluid. Electronically Signed   By: Reyes Holder M.D.   On: 08/16/2024 05:58   IR IMAGING GUIDED PORT INSERTION Result Date: 08/13/2024 INDICATION: 28 year old with metastatic uterine cancer. EXAM: FLUOROSCOPIC AND ULTRASOUND GUIDED PLACEMENT OF A SUBCUTANEOUS PORT MEDICATIONS: Benadryl  25 mg ANESTHESIA/SEDATION: Moderate (conscious) sedation was employed during this procedure. A total of Versed  3 mg and fentanyl  100 mcg was administered intravenously at the order of the provider performing the procedure. Total intra-service moderate sedation time: 33 minutes. Patient's level of consciousness and vital signs were monitored continuously by radiology nurse throughout the procedure under the supervision of the provider performing the procedure. FLUOROSCOPY TIME:  Radiation Exposure Index (as provided by the fluoroscopic device): 1 mGy Kerma COMPLICATIONS: None immediate. PROCEDURE: The procedure, risks, benefits, and alternatives were explained to the patient. Questions regarding the procedure were encouraged and answered. The patient understands and consents to the procedure. Patient was placed supine on the interventional table. Ultrasound confirmed a patent right internal jugular vein. Ultrasound image was saved for documentation. The right chest and neck were cleaned with a skin antiseptic and a sterile drape was placed. Maximal barrier sterile technique was utilized including caps, mask, sterile gowns, sterile gloves, sterile drape, hand hygiene and skin antiseptic. The right neck was anesthetized with 1% lidocaine . Small incision was made in the right neck with a blade. Micropuncture set was placed in the right internal jugular vein with ultrasound guidance. The micropuncture wire was used for measurement purposes. The right chest was anesthetized with 1% lidocaine  with epinephrine . #15 blade  was used to make an incision and a subcutaneous port pocket was formed. 8 french Power Port was assembled. Subcutaneous tunnel was formed with a stiff tunneling device. The port catheter was brought through the subcutaneous tunnel. The port was placed in the subcutaneous pocket. The micropuncture set was exchanged for a peel-away sheath. The catheter was placed through the peel-away sheath and the tip was positioned at the superior cavoatrial junction. Catheter placement was confirmed with fluoroscopy. The port was accessed and flushed with heparinized saline. The port pocket was closed using two layers of absorbable sutures and Dermabond. The vein skin site was closed using a single layer of absorbable suture and Dermabond. Sterile dressings were applied. Patient tolerated the procedure well without an immediate complication. Ultrasound and fluoroscopic images were taken and saved for this procedure. IMPRESSION: Placement of a subcutaneous power-injectable port device. Catheter tip  at the superior cavoatrial junction. Electronically Signed   By: Juliene Balder M.D.   On: 08/13/2024 18:00        Interval History: Doing well.  Happy to be back in [place] .  Reports excellent energy.  Has joined a running club.  Continues to have some pain with urination which she describes as pain when she is at the end of her stream and needs to push to get the last of her urine out.  Had been previously tested for urinary tract infection which was negative.  The sensation has been present since her surgery and having catheter in place.  Having some hot flashes which she describes as bearable.  Overall sleeping well although sometimes is awakened secondary to a hot flash.  Had some vaginal bleeding after having intercourse for the first time since surgery.  Endorses good bowel function, takes MiraLAX if needed for constipation.  Doing well after holding lenvatinib .  Past Medical/Surgical History: Past Medical  History:  Diagnosis Date   Endometrial cancer (HCC) 11/18/2019   Dr. Lansing [place]  [facility]   Malignant neoplasm of endometrium [facility]) 11/02/2019   Formatting of this note is different from the original.  Referral Dr. Darian Kopp.      10/09/19: EMB showed comnplex atypical hyperplasia, suspicious for endometrioid carcinoma. (Sent for outside consult).      10/15/19: EMB showed well differentiated endometrioid adenocarcinoma, FIGO gr 1.      OUTSIDE SLIDE REVIEW     A.  Endometrium, biopsy ([id number], Part A, 10/09/2019):   Microscopic foci     Past Surgical History:  Procedure Laterality Date   ABDOMINAL HYSTERECTOMY  06/22/2024   In [place]    APPENDECTOMY     CHOLECYSTECTOMY     DILATION AND CURETTAGE OF UTERUS     x2   IR IMAGING GUIDED PORT INSERTION  08/13/2024    Family History  Problem Relation Age of Onset   Colon cancer Mother        dx early 51s   Diabetes Father    Diabetes Paternal Grandmother    Ovarian cancer Neg Hx    Breast cancer Neg Hx    Endometrial cancer Neg Hx    Prostate cancer Neg Hx    Pancreatic cancer Neg Hx     Social History   Socioeconomic History   Marital status: Single    Spouse name: Not on file   Number of children: Not on file   Years of education: Not on file   Highest education level: Not on file  Occupational History   Not on file  Tobacco Use   Smoking status: Never   Smokeless tobacco: Never  Vaping Use   Vaping status: Never Used  Substance and Sexual Activity   Alcohol use: Not Currently    Comment: weekends   Drug use: Never   Sexual activity: Yes    Birth control/protection: I.U.D.  Other Topics Concern   Not on file  Social History Narrative   Not on file   Social Drivers of Health   Financial Resource Strain: Low Risk  (08/20/2024)   Overall Financial Resource Strain (CARDIA)    Difficulty of Paying Living Expenses: Not hard at all  Food Insecurity: No Food Insecurity (08/20/2024)    Hunger Vital Sign    Worried About Running Out of Food in the Last Year: Never true    Ran Out of Food in the Last Year: Never true  Transportation Needs: No  Transportation Needs (08/20/2024)   PRAPARE - Administrator, Civil Service (Medical): No    Lack of Transportation (Non-Medical): No  Physical Activity: Not on file  Stress: Not on file  Social Connections: Unknown (06/17/2022)   Received from [facility]   Social Network    Social Network: Not on file    Current Medications:  Current Outpatient Medications:    acetaminophen (TYLENOL) 325 MG tablet, Take 325 mg by mouth every 6 (six) hours as needed for moderate pain (pain score 4-6)., Disp: , Rfl:    estradiol  (ESTRACE ) 0.1 MG/GM vaginal cream, Place 1 Applicatorful vaginally at bedtime., Disp: 60 g, Rfl: 12   lenvatinib  10 mg daily dose (LENVIMA ) capsule, Take 1 capsule (10 mg total) by mouth daily., Disp: 30 capsule, Rfl: 11   lidocaine -prilocaine  (EMLA ) cream, Apply to affected area once, Disp: 30 g, Rfl: 3   polyethylene glycol powder (GLYCOLAX/MIRALAX) 17 GM/SCOOP powder, Take 17 g by mouth daily., Disp: , Rfl:    prochlorperazine  (COMPAZINE ) 10 MG tablet, Take 1 tablet (10 mg total) by mouth every 6 (six) hours as needed., Disp: 30 tablet, Rfl: 3  Review of Systems: + Dysuria, vaginal bleeding, hot flashes Denies appetite changes, fevers, chills, fatigue, unexplained weight changes. Denies hearing loss, neck lumps or masses, mouth sores, ringing in ears or voice changes. Denies cough or wheezing.  Denies shortness of breath. Denies chest pain or palpitations. Denies leg swelling. Denies abdominal distention, pain, blood in stools, constipation, diarrhea, nausea, vomiting, or early satiety. Denies pain with intercourse, frequency, hematuria or incontinence. Denies pelvic pain, or vaginal discharge.   Denies joint pain, back pain or muscle pain/cramps. Denies itching, rash, or wounds. Denies dizziness,  headaches, numbness or seizures. Denies swollen lymph nodes or glands, denies easy bruising or bleeding. Denies anxiety, depression, confusion, or decreased concentration.  Physical Exam: BP 119/84 (BP Location: Left Arm, Patient Position: Sitting)   Pulse 93   Temp 97.9 F (36.6 C) (Oral)   Resp 18   Wt 128 lb 6.4 oz (58.2 kg)   LMP  (LMP Unknown)   SpO2 100%   BMI 23.48 kg/m  General: Alert, oriented, no acute distress. HEENT: Posterior oropharynx clear, sclera anicteric. Chest: Clear to auscultation bilaterally.  No wheezes or rhonchi. Cardiovascular: Regular rate and rhythm, no murmurs. Abdomen: soft, nontender.  Normoactive bowel sounds.  No masses or hepatosplenomegaly appreciated.  Well-healed scar. Extremities: Grossly normal range of motion.  Warm, well perfused.  No edema bilaterally. Skin: No rashes or lesions noted. Lymphatics: No cervical, supraclavicular, or inguinal adenopathy. GU: Normal appearing external genitalia without erythema, excoriation, or lesions.  Speculum exam reveals well-rugated vaginal mucosa.  There is copious white vaginal estrogen within the vagina, difficult to see vaginal apex.  Bimanual exam reveals cuff is smooth, no nodularity or masses palpated.    Laboratory & Radiologic Studies: 08/16/24: CT A/P 1. Decreased size of the peritoneal metastasis along the hepatic capsule. 2. The previously identified peritoneal/omental implants in the pelvis and anterior abdominal wall are not confidently identified although evaluation is made difficult by the lack of intraluminal enteric contrast material. Consider follow-up surveillance/restaging imaging with oral and IV contrast medium. 3. Increased size of a right pericardiophrenic lymph node now measuring 8 mm in short axis previously 4 mm, nonspecific suggest attention on follow-up imaging. 4. 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 5. Uterus is surgically absent, diffuse  thickening of the vagina, nonspecific but possibly reflecting posttreatment/postsurgical change. No asymmetric nodular enhancement along the vaginal cuff. Suggest attention on follow-up imaging. 6. Trace pelvic free fluid.  Assessment & Plan: Christina Alvarez is a 28 y.o. 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 Given tumor burden at surgery, adjuvant therapy was recommended and ultimately decision made to start pembro/lenvima  since in 07/2024.  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 TMB low, HRD -.  HER2 neg (1+). Negative germline testing.   Overall doing well.  Plan to restart Lenvima  at lower dose.  Discussed menopausal symptoms.  She is not very bothered by these.  We reviewed various nonhormonal options for treatment of hot flashes.  She would like to hold off at this time, but she will contact me if symptoms worsen or she becomes interested in starting treatment. Encouraged use of vaginal lubrication with intercourse.  Discussed her urinary symptoms.  If no improvement with vaginal estrogen, discussed trial of over-the-counter Azo.  Plan to see the patient for follow-up in 3 months.  22 minutes of total time was spent for this patient encounter, including preparation, face-to-face counseling with the patient and coordination of care, and documentation of the encounter.  Christina Dollar, MD  Division of Gynecologic Oncology  Department of Obstetrics and Gynecology  [facility]

## 2024-09-03 NOTE — ED Triage Notes (Signed)
 Pt came in for a possible infection around her port site. Pt just got her port a couple weeks ago. Pt stated she started to smell a foul odor three days ago and realized it was coming from the site.

## 2024-09-03 NOTE — Discharge Instructions (Addendum)
 Return for any problem.   Please take antibiotic as prescribed.  You should be contacted by Interventional Radiology on Monday morning for scheduling of your Port removal.  If you develop fever, chills, increased drainage around your port, increased redness around your Port, or weakness, you should return to the ED for evaluation.

## 2024-09-03 NOTE — ED Provider Notes (Signed)
 [place] EMERGENCY DEPARTMENT AT [facility] Provider Note   CSN: [id number] Arrival date & time: 09/03/24  1532     Patient presents with: Post-op Problem   Christina Alvarez is a 28 y.o. female.   24 female with prior medical history as detailed below presents for evaluation.  Patient with right IJ port.  Patient's port was placed on August 29 by Dr. Philip.  She has noted several days of mild drainage and odor from the site of the port.  She was sent to the ED for evaluation of possible port infection.  The history is provided by the patient and medical records.       Prior to Admission medications   Medication Sig Start Date End Date Taking? Authorizing Provider  acetaminophen (TYLENOL) 325 MG tablet Take 325 mg by mouth every 6 (six) hours as needed for moderate pain (pain score 4-6). 06/28/24   [provider]  estradiol  (ESTRACE ) 0.1 MG/GM vaginal cream Place 1 Applicatorful vaginally at bedtime. 08/20/24 09/19/24  Lonn Hicks, MD  lenvatinib  10 mg daily dose (LENVIMA ) capsule Take 1 capsule (10 mg total) by mouth daily. 08/26/24   Lonn Hicks, MD  lidocaine -prilocaine  (EMLA ) cream Apply to affected area once 08/06/24   Lonn Hicks, MD  polyethylene glycol powder (GLYCOLAX/MIRALAX) 17 GM/SCOOP powder Take 17 g by mouth daily. 06/29/24   [provider]  prochlorperazine  (COMPAZINE ) 10 MG tablet Take 1 tablet (10 mg total) by mouth every 6 (six) hours as needed. 08/06/24   Lonn Hicks, MD    Allergies: Patient has no known allergies.    Review of Systems  All other systems reviewed and are negative.   Updated Vital Signs BP 119/88 (BP Location: Left Arm)   Pulse 99   Temp 97.7 F (36.5 C) (Oral)   Resp 18   LMP  (LMP Unknown)   SpO2 100%   Physical Exam Vitals and nursing note reviewed.  Constitutional:      General: She is not in acute distress.    Appearance: Normal appearance. She is well-developed.  HENT:     Head: Normocephalic and  atraumatic.  Eyes:     Conjunctiva/sclera: Conjunctivae normal.     Pupils: Pupils are equal, round, and reactive to light.  Cardiovascular:     Rate and Rhythm: Normal rate and regular rhythm.     Heart sounds: Normal heart sounds.  Pulmonary:     Effort: Pulmonary effort is normal. No respiratory distress.     Breath sounds: Normal breath sounds.  Abdominal:     General: There is no distension.     Palpations: Abdomen is soft.     Tenderness: There is no abdominal tenderness.  Musculoskeletal:        General: No deformity. Normal range of motion.     Cervical back: Normal range of motion and neck supple.  Skin:    General: Skin is warm and dry.     Comments: Purulence is expressible from the port incision site.  See images below.  Minimal surrounding erythema.  Neurological:     General: No focal deficit present.     Mental Status: She is alert and oriented to person, place, and time.     (all labs ordered are listed, but only abnormal results are displayed) Labs Reviewed  CBC WITH DIFFERENTIAL/PLATELET - Abnormal; Notable for the following components:      Result Value   RDW 16.8 (*)    All other components within normal  limits  URINALYSIS, W/ REFLEX TO CULTURE (INFECTION SUSPECTED) - Abnormal; Notable for the following components:   APPearance HAZY (*)    Leukocytes,Ua TRACE (*)    All other components within normal limits  CULTURE, BLOOD (ROUTINE X 2)  CULTURE, BLOOD (ROUTINE X 2)  COMPREHENSIVE METABOLIC PANEL WITH GFR  I-STAT CG4 LACTIC ACID, ED  I-STAT CG4 LACTIC ACID, ED    EKG: None  Radiology: No results found.   Procedures   Medications Ordered in the ED - No data to display                                  Medical Decision Making Patient presents with early port infection.  Her port is only 68 weeks old.  Patient is nontoxic in appearance.  Screening labs obtained are without significant abnormality.  Patient desires discharge.  Case discussed  with interventional radiology -- Dr. Jennefer.  He agrees with plan to discharge on oral antibiotics.  Patient will be contacted by IR on Monday for port removal.  Importance of close follow-up is stressed.  Strict return precautions given and understood.  Amount and/or Complexity of Data Reviewed Labs: ordered.  Risk Prescription drug management.        Final diagnoses:  Port or reservoir infection, initial encounter    ED Discharge Orders          Ordered    doxycycline  (VIBRAMYCIN ) 100 MG capsule  2 times daily        09/03/24 1749               Laurice Maude BROCKS, MD 09/03/24 1755

## 2024-09-03 NOTE — Telephone Encounter (Signed)
 LVM for patient to check in to see if her hands and feet were improving. Patient aware that if they have improved, Dr. Lonn would like her to restart her Lenvima .  Provided call back number and requested for patient to return call to the clinic at her convenience to discuss matter further.

## 2024-09-03 NOTE — Telephone Encounter (Signed)
 S/w regarding MyChart message sent to Dr. Lonn. MyChart message indicated possible infection at port-site. Patient has been advised to seek immediate evaluation at the ER as she may require antibiotics and port removal. Patient also advised to hold Lenvima  in light of this new possible infection. Patient verbalized an understanding of the information and states that she will be heading to the ER after call with RN. Dr. Lonn aware of the outcome of phone call.

## 2024-09-04 ENCOUNTER — Other Ambulatory Visit: Payer: Self-pay

## 2024-09-06 ENCOUNTER — Other Ambulatory Visit: Payer: Self-pay

## 2024-09-06 ENCOUNTER — Encounter (HOSPITAL_COMMUNITY): Payer: Self-pay | Admitting: Emergency Medicine

## 2024-09-06 ENCOUNTER — Emergency Department (HOSPITAL_COMMUNITY): Admission: EM | Admit: 2024-09-06 | Discharge: 2024-09-06 | Disposition: A | Discharge status: Home / Self Care

## 2024-09-06 ENCOUNTER — Other Ambulatory Visit (HOSPITAL_COMMUNITY): Payer: Self-pay | Admitting: Interventional Radiology

## 2024-09-06 ENCOUNTER — Emergency Department (HOSPITAL_COMMUNITY)

## 2024-09-06 DIAGNOSIS — Z8542 Personal history of malignant neoplasm of other parts of uterus: Secondary | ICD-10-CM | POA: Insufficient documentation

## 2024-09-06 DIAGNOSIS — Z4801 Encounter for change or removal of surgical wound dressing: Secondary | ICD-10-CM | POA: Diagnosis present

## 2024-09-06 DIAGNOSIS — Z4889 Encounter for other specified surgical aftercare: Secondary | ICD-10-CM

## 2024-09-06 DIAGNOSIS — T80212S Local infection due to central venous catheter, sequela: Secondary | ICD-10-CM

## 2024-09-06 HISTORY — PX: IR RADIOLOGIST EVAL & MGMT: IMG5224

## 2024-09-06 NOTE — ED Triage Notes (Signed)
 Pt reports she is returning today for wound check of her port. Pt reports she was told she may need it taken out.

## 2024-09-06 NOTE — ED Provider Notes (Signed)
 [place] EMERGENCY DEPARTMENT AT [facility] Provider Note   CSN: [id number] Arrival date & time: 09/06/24  9347     Patient presents with: Wound Infection   Christina Alvarez is a 28 y.o. female with PMHx endometrial cancer who presents to ED for reevaluation of her possible port infection. Patient was seen in ED 3 days ago for same complaint and was discharged on doxycycline  when she has been compliant on. Patient endorsing improvement of the appearance of her port site. Patient denies recent fever, chest pain, cough, dyspnea, nausea, vomiting, diarrhea.  Patient stating that her next infusion for her endometrial cancer is this upcoming Friday.   HPI     Prior to Admission medications   Medication Sig Start Date End Date Taking? Authorizing Provider  acetaminophen (TYLENOL) 325 MG tablet Take 325 mg by mouth every 6 (six) hours as needed for moderate pain (pain score 4-6). 06/28/24   [provider]  doxycycline  (VIBRAMYCIN ) 100 MG capsule Take 1 capsule (100 mg total) by mouth 2 (two) times daily. 09/03/24   Laurice Maude BROCKS, MD  estradiol  (ESTRACE ) 0.1 MG/GM vaginal cream Place 1 Applicatorful vaginally at bedtime. 08/20/24 09/19/24  Lonn Hicks, MD  lenvatinib  10 mg daily dose (LENVIMA ) capsule Take 1 capsule (10 mg total) by mouth daily. 08/26/24   Lonn Hicks, MD  lidocaine -prilocaine  (EMLA ) cream Apply to affected area once 08/06/24   Lonn Hicks, MD  polyethylene glycol powder (GLYCOLAX/MIRALAX) 17 GM/SCOOP powder Take 17 g by mouth daily. 06/29/24   [provider]  prochlorperazine  (COMPAZINE ) 10 MG tablet Take 1 tablet (10 mg total) by mouth every 6 (six) hours as needed. 08/06/24   Lonn Hicks, MD    Allergies: Patient has no known allergies.    Review of Systems  Skin:  Positive for wound.    Updated Vital Signs BP 107/81   Pulse 90   Temp 98.1 F (36.7 C)   Resp 16   LMP  (LMP Unknown)   SpO2 99%   Physical Exam Vitals and nursing note  reviewed.  Constitutional:      General: She is not in acute distress.    Appearance: She is not ill-appearing or toxic-appearing.  HENT:     Head: Normocephalic and atraumatic.     Mouth/Throat:     Mouth: Mucous membranes are moist.  Eyes:     General: No scleral icterus.       Right eye: No discharge.        Left eye: No discharge.     Conjunctiva/sclera: Conjunctivae normal.  Cardiovascular:     Rate and Rhythm: Normal rate and regular rhythm.     Pulses: Normal pulses.     Heart sounds: Normal heart sounds. No murmur heard. Pulmonary:     Effort: Pulmonary effort is normal. No respiratory distress.     Breath sounds: Normal breath sounds. No wheezing, rhonchi or rales.  Abdominal:     General: Abdomen is flat.  Musculoskeletal:     Right lower leg: No edema.     Left lower leg: No edema.  Skin:    General: Skin is warm and dry.     Findings: No rash.     Comments: Port site clean, dry, and with some dehiscence. No surrounding swelling or erythema  Neurological:     General: No focal deficit present.     Mental Status: She is alert. Mental status is at baseline.  Psychiatric:  Mood and Affect: Mood normal.     (all labs ordered are listed, but only abnormal results are displayed) Labs Reviewed - No data to display  EKG: None  Radiology: No results found.   Procedures   Medications Ordered in the ED - No data to display                                  Medical Decision Making  This patient presents to the ED for concern of possible port site infection, this involves an extensive number of treatment options, and is a complaint that carries with it a high risk of complications and morbidity.  The differential diagnosis includes irritant contact dermatitis, cellulitis, abscess, sepsis  Co morbidities that complicate the patient evaluation  Endometrial cancer   Additional history obtained:  Additional history obtained from 9/19 ED note: patient  discharged on doxycycline  and IR planning on calling her for repeat appointment on the morning of 9/22 - patient came to ED prior to hearing back from IR team.    Problem List / ED Course / Critical interventions / Medication management  Patient presents to ED for port evaluation.  Port has been healing well since being on doxycycline  x3 days. Patient denies any recent infectious symptoms. Physical exam reassuring. Patient afebrile with stable vitals. I requested consultation with the IR provider on-call Dr. Hughes,  and discussed lab and imaging findings as well as pertinent plan - they were able to evaluate patient in ED. Please see Darrell Allred, PA-C note for further detail. Patient arrived back from IR evaluation and is ready to go home. Patient verbalized understanding of plan to continue ABX and follow up at the IR department this Friday. I have reviewed the patients home medicines and have made adjustments as needed The patient has been appropriately medically screened and/or stabilized in the ED. I have low suspicion for any other emergent medical condition which would require further screening, evaluation or treatment in the ED or require inpatient management. At time of discharge the patient is hemodynamically stable and in no acute distress. I have discussed work-up results and diagnosis with patient and answered all questions. Patient is agreeable with discharge plan. We discussed strict return precautions for returning to the emergency department and they verbalized understanding.     Social Determinants of Health:  none       Final diagnoses:  Encounter for post surgical wound check    ED Discharge Orders     None          Hoy Nidia FALCON, [place] 09/06/24 0935    Ula Prentice SAUNDERS, MD 09/06/24 1243

## 2024-09-06 NOTE — ED Notes (Addendum)
 Pt seen for the same on 9/19. Returns for f/u: re-check. Continuing her doxy, has had BID for 2d. States, looks better, drainage decreased. Denies pain, redness, swelling, NVD, fever chills body aches or dizziness.

## 2024-09-06 NOTE — ED Notes (Signed)
Delay in d/c d/t influx of pts 

## 2024-09-06 NOTE — Discharge Instructions (Addendum)
 As discussed, you will need to follow-up with the IR department on Friday.  Please continue your antibiotic regimen.  Seek emergency care experiencing any new or worsening symptoms.

## 2024-09-06 NOTE — Progress Notes (Addendum)
 Patient ID: Christina Alvarez, female   DOB: 12/26/1995, 28 y.o.   MRN: [id number] Patient seen in IR department today for evaluation of Port-A-Cath site.  She has a history of metastatic uterine cancer and a right chest wall Port-A-Cath which was placed by IR on 08/13/2024.  She recently noticed some drainage from port reservoir incision along with some dehiscence of wound.  She denies fever, chills.  She has been on doxycycline  since Friday of last week.  On exam there is some mild dehiscence of wound but no active drainage at this time ;scab is in place.  There is no significant tenderness to palpation, edema or erythema.  Patient was seen by Dr.Mugweru.  Recommend continuation of antibiotic therapy at this time.  3 Steri-Strips were applied to site following cleansing with chlorhexidine.  Site care instructions were given to patient.  Patient will return to IR department on Friday of this week for f/u.  Patient was told to contact our service with any additional questions or concerns.  Photo in media section.

## 2024-09-08 LAB — CULTURE, BLOOD (ROUTINE X 2): Culture: NO GROWTH

## 2024-09-10 ENCOUNTER — Ambulatory Visit (HOSPITAL_COMMUNITY)
Admission: RE | Admit: 2024-09-10 | Discharge: 2024-09-10 | Disposition: A | Discharge status: Home / Self Care | Source: Ambulatory Visit | Attending: Interventional Radiology | Admitting: Interventional Radiology

## 2024-09-10 ENCOUNTER — Inpatient Hospital Stay

## 2024-09-10 ENCOUNTER — Other Ambulatory Visit (HOSPITAL_COMMUNITY): Payer: Self-pay | Admitting: Interventional Radiology

## 2024-09-10 ENCOUNTER — Other Ambulatory Visit (HOSPITAL_COMMUNITY)

## 2024-09-10 ENCOUNTER — Encounter: Payer: Self-pay | Admitting: Hematology and Oncology

## 2024-09-10 ENCOUNTER — Encounter (HOSPITAL_COMMUNITY): Payer: Self-pay | Admitting: Radiology

## 2024-09-10 ENCOUNTER — Inpatient Hospital Stay: Admitting: Hematology and Oncology

## 2024-09-10 ENCOUNTER — Other Ambulatory Visit: Payer: Self-pay

## 2024-09-10 VITALS — BP 114/76 | HR 98 | Temp 98.1°F | Resp 18 | Ht 62.0 in | Wt 131.8 lb

## 2024-09-10 DIAGNOSIS — T80212A Local infection due to central venous catheter, initial encounter: Secondary | ICD-10-CM | POA: Diagnosis not present

## 2024-09-10 DIAGNOSIS — C549 Malignant neoplasm of corpus uteri, unspecified: Secondary | ICD-10-CM | POA: Diagnosis not present

## 2024-09-10 DIAGNOSIS — T80212S Local infection due to central venous catheter, sequela: Secondary | ICD-10-CM

## 2024-09-10 DIAGNOSIS — D696 Thrombocytopenia, unspecified: Secondary | ICD-10-CM | POA: Diagnosis not present

## 2024-09-10 DIAGNOSIS — Z5112 Encounter for antineoplastic immunotherapy: Secondary | ICD-10-CM | POA: Diagnosis not present

## 2024-09-10 HISTORY — PX: IR PATIENT EVAL TECH 0-60 MINS: IMG5564

## 2024-09-10 LAB — CBC WITH DIFFERENTIAL (CANCER CENTER ONLY)
Abs Immature Granulocytes: 0.01 K/uL (ref 0.00–0.07)
Basophils Absolute: 0 K/uL (ref 0.0–0.1)
Basophils Relative: 0 %
Eosinophils Absolute: 0 K/uL (ref 0.0–0.5)
Eosinophils Relative: 1 %
HCT: 40 % (ref 36.0–46.0)
Hemoglobin: 13.3 g/dL (ref 12.0–15.0)
Immature Granulocytes: 0 %
Lymphocytes Relative: 40 %
Lymphs Abs: 2 K/uL (ref 0.7–4.0)
MCH: 29.4 pg (ref 26.0–34.0)
MCHC: 33.3 g/dL (ref 30.0–36.0)
MCV: 88.5 fL (ref 80.0–100.0)
Monocytes Absolute: 0.4 K/uL (ref 0.1–1.0)
Monocytes Relative: 9 %
Neutro Abs: 2.6 K/uL (ref 1.7–7.7)
Neutrophils Relative %: 50 %
Platelet Count: 149 K/uL — ABNORMAL LOW (ref 150–400)
RBC: 4.52 MIL/uL (ref 3.87–5.11)
RDW: 16.1 % — ABNORMAL HIGH (ref 11.5–15.5)
WBC Count: 5.1 K/uL (ref 4.0–10.5)
nRBC: 0 % (ref 0.0–0.2)

## 2024-09-10 LAB — CMP (CANCER CENTER ONLY)
ALT: 14 U/L (ref 0–44)
AST: 16 U/L (ref 15–41)
Albumin: 4.6 g/dL (ref 3.5–5.0)
Alkaline Phosphatase: 44 U/L (ref 38–126)
Anion gap: 6 (ref 5–15)
BUN: 13 mg/dL (ref 6–20)
CO2: 29 mmol/L (ref 22–32)
Calcium: 9.7 mg/dL (ref 8.9–10.3)
Chloride: 107 mmol/L (ref 98–111)
Creatinine: 0.72 mg/dL (ref 0.44–1.00)
GFR, Estimated: >60 mL/min (ref >60)
Glucose, Bld: 84 mg/dL (ref 70–99)
Potassium: 3.8 mmol/L (ref 3.5–5.1)
Sodium: 142 mmol/L (ref 135–145)
Total Bilirubin: 1.1 mg/dL (ref 0.0–1.2)
Total Protein: 7.7 g/dL (ref 6.5–8.1)

## 2024-09-10 LAB — TSH: TSH: 0.96 u[IU]/mL (ref 0.350–4.500)

## 2024-09-10 MED ORDER — SODIUM CHLORIDE 0.9 % IV SOLN: INTRAVENOUS | Status: DC

## 2024-09-10 MED ORDER — SODIUM CHLORIDE 0.9 % IV SOLN
200.0000 mg | Freq: Once | INTRAVENOUS | Status: AC
  Administered 2024-09-10: 200 mg via INTRAVENOUS
  Filled 2024-09-10: qty 200

## 2024-09-10 NOTE — Progress Notes (Signed)
 Patient ID: Christina Alvarez, female   DOB: October 24, 1996, 28 y.o.   MRN: [id number] Patient presented to IR department again today for recheck of right chest wall Port-A-Cath site.  She reports no fever, chills, pain, drainage at port site.  Steri-Strips still in place.  She continues on doxycycline .  No change in treatment recommendations at this time ;patient will follow-up with IR on 9/30.  Recommend not using port until after IR evaluation on 9/30.

## 2024-09-10 NOTE — Progress Notes (Signed)
 Patient here for port flush/labs.  States she went to ER due to pus noted coming from her port. States she was told today her port would be taken out.  She was given ABT for infection.  Niki/RN Press photographer assessed patient.  Patient scheduled for lab appointment for peripheral labs to be drawn.  Maire, will follow up with patient after labs are drawn. Patient made aware to wait in the waiting room after labs are done.

## 2024-09-10 NOTE — Assessment & Plan Note (Addendum)
This is likely due to recent treatment. The patient denies recent history of bleeding such as epistaxis, hematuria or hematochezia. She is asymptomatic from the low platelet count. I will observe for now.  

## 2024-09-10 NOTE — Procedures (Signed)
  Allred, Darrell K, PA-C  Physician Assistant Radiology   Progress Notes    Signed   Date of Service: 09/10/2024 12:00 PM   Signed      Patient ID: Christina Alvarez, female   DOB: 1996-07-25, 28 y.o.   MRN: [id number] Patient presented to IR department again today for recheck of right chest wall Port-A-Cath site.  She reports no fever, chills, pain, drainage at port site.  Steri-Strips still in place.  She continues on doxycycline .  No change in treatment recommendations at this time ;patient will follow-up with IR on 9/30.  Recommend not using port until after IR evaluation on 9/30.

## 2024-09-10 NOTE — Assessment & Plan Note (Addendum)
 The patient was originally diagnosed with uterine cancer in 2020 and subsequently lost to follow-up She was subsequently evaluated and treated in [place] , records were reviewed and summarized in the oncologic history.  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 p53 stain shows mutant pattern staining.  MSI stable, MMR intact, foundation One: Low tumor mutation burden, HRD negative.  Genetic testing from January 2025 was negative  She received first cycle of combination of pembrolizumab  and lenvatinib  on July 30, 2024 which she tolerated well Multiple CT imaging in September show no significant changes/active disease She will continue Keytruda  as scheduled Due to her recent port issue with infection/dehiscence, she will continue to hold lenvatinib 

## 2024-09-10 NOTE — Assessment & Plan Note (Addendum)
 She is currently being managed conservatively Continue antibiotics She will continue to hold lenvatinib 

## 2024-09-10 NOTE — Patient Instructions (Signed)

## 2024-09-10 NOTE — Progress Notes (Signed)
 [facility] OFFICE PROGRESS NOTE  Patient Care Team: Nedra Tinnie LABOR, NP as PCP - General (Internal Medicine) Lonn Hicks, MD as Consulting Physician (Hematology and Oncology)  Assessment & Plan Malignant neoplasm of body of uterus, unspecified site [facility]) The patient was originally diagnosed with uterine cancer in 2020 and subsequently lost to follow-up She was subsequently evaluated and treated in [place] , records were reviewed and summarized in the oncologic history.  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 p53 stain shows mutant pattern staining.  MSI stable, MMR intact, foundation One: Low tumor mutation burden, HRD negative.  Genetic testing from January 2025 was negative  She received first cycle of combination of pembrolizumab  and lenvatinib  on July 30, 2024 which she tolerated well Multiple CT imaging in September show no significant changes/active disease She will continue Keytruda  as scheduled Due to her recent port issue with infection/dehiscence, she will continue to hold lenvatinib  Thrombocytopenia This is likely due to recent treatment. The patient denies recent history of bleeding such as epistaxis, hematuria or hematochezia. She is asymptomatic from the low platelet count. I will observe for now.   Local infection due to Port-A-Cath, initial encounter She is currently being managed conservatively Continue antibiotics She will continue to hold lenvatinib   No orders of the defined types were placed in this encounter.    Hicks Lonn, MD  INTERVAL HISTORY: she  returns for treatment follow-up Complications related to previous cycle of chemotherapy included infection, She had recurrent visits to the emergency department due to local port infection She is currently on a prolonged course of antibiotics  PHYSICAL EXAMINATION: ECOG PERFORMANCE STATUS: 1 - Symptomatic but completely ambulatory  Lab Results  Component Value Date   CAN125 4.3 08/20/2024      Latest Ref Rng & Units 09/10/2024    1:05 PM 09/03/2024    4:13 PM 08/17/2024    7:18 AM  CBC  WBC 4.0 - 10.5 K/uL 5.1  4.8  4.5   Hemoglobin 12.0 - 15.0 g/dL 86.6  86.6  86.5   Hematocrit 36.0 - 46.0 % 40.0  41.9  39.4   Platelets 150 - 400 K/uL 149  154  109       Chemistry      Component Value Date/Time   NA 142 09/10/2024 1305   K 3.8 09/10/2024 1305   CL 107 09/10/2024 1305   CO2 29 09/10/2024 1305   BUN 13 09/10/2024 1305   CREATININE 0.72 09/10/2024 1305      Component Value Date/Time   CALCIUM 9.7 09/10/2024 1305   ALKPHOS 44 09/10/2024 1305   AST 16 09/10/2024 1305   ALT 14 09/10/2024 1305   BILITOT 1.1 09/10/2024 1305       Vitals:   09/10/24 1419  BP: 114/76  Pulse: 98  Resp: 18  Temp: 98.1 F (36.7 C)  SpO2: 100%   Filed Weights   09/10/24 1419  Weight: 131 lb 12.8 oz (59.8 kg)   Other relevant data reviewed during this visit included CBC, CMP

## 2024-09-11 LAB — T4: T4, Total: 9.6 ug/dL (ref 4.5–12.0)

## 2024-09-14 ENCOUNTER — Ambulatory Visit (HOSPITAL_COMMUNITY)
Admission: RE | Admit: 2024-09-14 | Discharge: 2024-09-14 | Disposition: A | Discharge status: Home / Self Care | Source: Ambulatory Visit | Attending: Interventional Radiology | Admitting: Interventional Radiology

## 2024-09-14 ENCOUNTER — Other Ambulatory Visit (HOSPITAL_COMMUNITY): Payer: Self-pay | Admitting: Interventional Radiology

## 2024-09-14 ENCOUNTER — Telehealth: Payer: Self-pay

## 2024-09-14 DIAGNOSIS — T80212S Local infection due to central venous catheter, sequela: Secondary | ICD-10-CM

## 2024-09-14 HISTORY — PX: IR RADIOLOGIST EVAL & MGMT: IMG5224

## 2024-09-14 NOTE — Telephone Encounter (Signed)
 Called and told her Dr. Lonn to resume [place] . She verbalized understanding.

## 2024-09-14 NOTE — Progress Notes (Signed)
 Patient presented to IR today for a follow up port site check.  While being observed by J. Mugweru, MD, there was noted that there was a small suture string at the skin site.  The suture string was clipped and dermabond was placed over the small hole and a dry dressing was placed over the site.   Patient was instructed to return to IR for a port site check on 10/01/2024 and to call with any questions or concerns in the meantime.

## 2024-10-01 ENCOUNTER — Inpatient Hospital Stay: Admitting: Hematology and Oncology

## 2024-10-01 ENCOUNTER — Inpatient Hospital Stay: Attending: Hematology and Oncology

## 2024-10-01 ENCOUNTER — Inpatient Hospital Stay

## 2024-10-01 ENCOUNTER — Ambulatory Visit (HOSPITAL_COMMUNITY)
Admission: RE | Admit: 2024-10-01 | Discharge: 2024-10-01 | Disposition: A | Source: Ambulatory Visit | Attending: Interventional Radiology

## 2024-10-01 VITALS — BP 114/77 | HR 96 | Temp 98.2°F | Resp 16 | Wt 127.0 lb

## 2024-10-01 DIAGNOSIS — T80212S Local infection due to central venous catheter, sequela: Secondary | ICD-10-CM

## 2024-10-01 DIAGNOSIS — C787 Secondary malignant neoplasm of liver and intrahepatic bile duct: Secondary | ICD-10-CM | POA: Insufficient documentation

## 2024-10-01 DIAGNOSIS — C549 Malignant neoplasm of corpus uteri, unspecified: Secondary | ICD-10-CM

## 2024-10-01 DIAGNOSIS — Z5112 Encounter for antineoplastic immunotherapy: Secondary | ICD-10-CM | POA: Diagnosis present

## 2024-10-01 DIAGNOSIS — L271 Localized skin eruption due to drugs and medicaments taken internally: Secondary | ICD-10-CM

## 2024-10-01 DIAGNOSIS — D696 Thrombocytopenia, unspecified: Secondary | ICD-10-CM | POA: Insufficient documentation

## 2024-10-01 DIAGNOSIS — C786 Secondary malignant neoplasm of retroperitoneum and peritoneum: Secondary | ICD-10-CM

## 2024-10-01 DIAGNOSIS — C7889 Secondary malignant neoplasm of other digestive organs: Secondary | ICD-10-CM

## 2024-10-01 DIAGNOSIS — R82998 Other abnormal findings in urine: Secondary | ICD-10-CM | POA: Diagnosis not present

## 2024-10-01 DIAGNOSIS — Z170 Estrogen receptor positive status [ER+]: Secondary | ICD-10-CM | POA: Diagnosis not present

## 2024-10-01 DIAGNOSIS — C541 Malignant neoplasm of endometrium: Secondary | ICD-10-CM | POA: Diagnosis not present

## 2024-10-01 DIAGNOSIS — T80212A Local infection due to central venous catheter, initial encounter: Secondary | ICD-10-CM

## 2024-10-01 DIAGNOSIS — C7911 Secondary malignant neoplasm of bladder: Secondary | ICD-10-CM | POA: Diagnosis not present

## 2024-10-01 HISTORY — PX: IR RADIOLOGIST EVAL & MGMT: IMG5224

## 2024-10-01 LAB — CMP (CANCER CENTER ONLY)
ALT: 20 U/L (ref 0–44)
AST: 20 U/L (ref 15–41)
Albumin: 4.6 g/dL (ref 3.5–5.0)
Alkaline Phosphatase: 49 U/L (ref 38–126)
Anion gap: 5 (ref 5–15)
BUN: 14 mg/dL (ref 6–20)
CO2: 29 mmol/L (ref 22–32)
Calcium: 10.2 mg/dL (ref 8.9–10.3)
Chloride: 106 mmol/L (ref 98–111)
Creatinine: 0.69 mg/dL (ref 0.44–1.00)
GFR, Estimated: >60 mL/min (ref >60)
Glucose, Bld: 103 mg/dL — ABNORMAL HIGH (ref 70–99)
Potassium: 3.6 mmol/L (ref 3.5–5.1)
Sodium: 140 mmol/L (ref 135–145)
Total Bilirubin: 1 mg/dL (ref 0.0–1.2)
Total Protein: 7.7 g/dL (ref 6.5–8.1)

## 2024-10-01 LAB — CBC WITH DIFFERENTIAL (CANCER CENTER ONLY)
Abs Immature Granulocytes: 0.01 K/uL (ref 0.00–0.07)
Basophils Absolute: 0 K/uL (ref 0.0–0.1)
Basophils Relative: 0 %
Eosinophils Absolute: 0 K/uL (ref 0.0–0.5)
Eosinophils Relative: 0 %
HCT: 41.3 % (ref 36.0–46.0)
Hemoglobin: 14.2 g/dL (ref 12.0–15.0)
Immature Granulocytes: 0 %
Lymphocytes Relative: 43 %
Lymphs Abs: 2.3 K/uL (ref 0.7–4.0)
MCH: 29.2 pg (ref 26.0–34.0)
MCHC: 34.4 g/dL (ref 30.0–36.0)
MCV: 84.8 fL (ref 80.0–100.0)
Monocytes Absolute: 0.4 K/uL (ref 0.1–1.0)
Monocytes Relative: 7 %
Neutro Abs: 2.6 K/uL (ref 1.7–7.7)
Neutrophils Relative %: 50 %
Platelet Count: 132 K/uL — ABNORMAL LOW (ref 150–400)
RBC: 4.87 MIL/uL (ref 3.87–5.11)
RDW: 14.5 % (ref 11.5–15.5)
WBC Count: 5.3 K/uL (ref 4.0–10.5)
nRBC: 0 % (ref 0.0–0.2)

## 2024-10-01 MED ORDER — SODIUM CHLORIDE 0.9 % IV SOLN: INTRAVENOUS | Status: DC

## 2024-10-01 MED ORDER — SODIUM CHLORIDE 0.9 % IV SOLN
200.0000 mg | Freq: Once | INTRAVENOUS | Status: AC
  Administered 2024-10-01: 200 mg via INTRAVENOUS
  Filled 2024-10-01: qty 200

## 2024-10-01 NOTE — Assessment & Plan Note (Addendum)
 The patient was originally diagnosed with uterine cancer in 2020 and subsequently lost to follow-up She was subsequently evaluated and treated in [place] , records were reviewed and summarized in the oncologic history.  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 p53 stain shows mutant pattern staining.  MSI stable, MMR intact, foundation One: Low tumor mutation burden, HRD negative.  Genetic testing from January 2025 was negative  She received first cycle of combination of pembrolizumab  and lenvatinib  on July 30, 2024 which she tolerated well Multiple CT imaging in September show no significant changes/active disease She will continue Keytruda  as scheduled Due to her recent port issue with infection/dehiscence, lenvatinib  was placed on hold Today, her port appears completely healed She will resume lenvatinib  with reduced dose at 10 mg daily along with pembrolizumab  I will see her again in 3 weeks for further follow-up

## 2024-10-01 NOTE — Assessment & Plan Note (Addendum)
 This is resolved

## 2024-10-01 NOTE — Assessment & Plan Note (Addendum)
This is likely due to recent treatment. The patient denies recent history of bleeding such as epistaxis, hematuria or hematochezia. She is asymptomatic from the low platelet count. I will observe for now.  

## 2024-10-01 NOTE — Progress Notes (Signed)
 [facility] OFFICE PROGRESS NOTE  Patient Care Team: Nedra Tinnie LABOR, NP as PCP - General (Internal Medicine) Lonn Hicks, MD as Consulting Physician (Hematology and Oncology)  Assessment & Plan Malignant neoplasm of body of uterus, unspecified site [facility]) The patient was originally diagnosed with uterine cancer in 2020 and subsequently lost to follow-up She was subsequently evaluated and treated in [place] , records were reviewed and summarized in the oncologic history.  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 p53 stain shows mutant pattern staining.  MSI stable, MMR intact, foundation One: Low tumor mutation burden, HRD negative.  Genetic testing from January 2025 was negative  She received first cycle of combination of pembrolizumab  and lenvatinib  on July 30, 2024 which she tolerated well Multiple CT imaging in September show no significant changes/active disease She will continue Keytruda  as scheduled Due to her recent port issue with infection/dehiscence, lenvatinib  was placed on hold Today, her port appears completely healed She will resume lenvatinib  with reduced dose at 10 mg daily along with pembrolizumab  I will see her again in 3 weeks for further follow-up Hand foot syndrome This has resolved I am hopeful with reduced dose lenvatinib , this will not recur Thrombocytopenia This is likely due to recent treatment. The patient denies recent history of bleeding such as epistaxis, hematuria or hematochezia. She is asymptomatic from the low platelet count. I will observe for  now.   Local infection due to Port-A-Cath, initial encounter This is resolved  No orders of the defined types were placed in this encounter.    Hicks Lonn, MD  INTERVAL HISTORY: she returns for treatment follow-up Complications related to previous cycle of chemotherapy included thrombocytopenia,  PHYSICAL EXAMINATION: ECOG PERFORMANCE STATUS: 0 - Asymptomatic  Lab Results  Component Value Date   CAN125 4.3 08/20/2024      Latest Ref Rng & Units 10/01/2024   11:25 AM 09/10/2024    1:05 PM 09/03/2024    4:13 PM  CBC  WBC 4.0 - 10.5 K/uL 5.3  5.1  4.8   Hemoglobin 12.0 - 15.0 g/dL 85.7  86.6  86.6   Hematocrit 36.0 - 46.0 % 41.3  40.0  41.9   Platelets 150 - 400 K/uL 132  149  154       Chemistry      Component Value Date/Time   NA 140 10/01/2024 1125   K 3.6 10/01/2024 1125   CL 106 10/01/2024 1125   CO2 29 10/01/2024 1125   BUN 14 10/01/2024 1125   CREATININE 0.69 10/01/2024 1125      Component Value Date/Time   CALCIUM 10.2 10/01/2024 1125   ALKPHOS 49 10/01/2024 1125   AST 20 10/01/2024 1125   ALT 20 10/01/2024 1125   BILITOT 1.0 10/01/2024 1125       There were no vitals filed for this visit. There were no vitals filed for this visit. Other relevant data reviewed during this visit included CBC and CMP

## 2024-10-01 NOTE — Progress Notes (Signed)
 Patient presented to IR department for a follow up on her port incision.  Port site was clean, dry, intact and a bit scabbed over but overall in great condition and ready for use.  Patient told to return to IR if she has any other concerns or questions regarding the port.

## 2024-10-01 NOTE — Assessment & Plan Note (Addendum)
 This has resolved I am hopeful with reduced dose lenvatinib , this will not recur

## 2024-10-01 NOTE — Patient Instructions (Signed)

## 2024-10-04 ENCOUNTER — Encounter: Payer: Self-pay | Admitting: Hematology and Oncology

## 2024-10-05 ENCOUNTER — Inpatient Hospital Stay

## 2024-10-05 ENCOUNTER — Other Ambulatory Visit: Payer: Self-pay

## 2024-10-05 ENCOUNTER — Telehealth: Payer: Self-pay

## 2024-10-05 DIAGNOSIS — Z5112 Encounter for antineoplastic immunotherapy: Secondary | ICD-10-CM | POA: Diagnosis not present

## 2024-10-05 DIAGNOSIS — R8290 Unspecified abnormal findings in urine: Secondary | ICD-10-CM

## 2024-10-05 LAB — URINALYSIS, COMPLETE (UACMP) WITH MICROSCOPIC
Bilirubin Urine: NEGATIVE
Glucose, UA: NEGATIVE mg/dL
Ketones, ur: NEGATIVE mg/dL
Nitrite: NEGATIVE
Protein, ur: NEGATIVE mg/dL
Specific Gravity, Urine: 1.021 (ref 1.005–1.030)
WBC, UA: >50 WBC/hpf (ref 0–5)
pH: 5 (ref 5.0–8.0)

## 2024-10-05 NOTE — Telephone Encounter (Signed)
 Called and left a message asking her to call the office back. Calling regarding mychart message for complaint about urine.

## 2024-10-07 ENCOUNTER — Other Ambulatory Visit: Payer: Self-pay | Admitting: Hematology and Oncology

## 2024-10-07 ENCOUNTER — Ambulatory Visit: Payer: Self-pay | Admitting: Hematology and Oncology

## 2024-10-07 LAB — URINE CULTURE: Culture: 80000 — AB

## 2024-10-07 MED ORDER — CEFPODOXIME PROXETIL 100 MG PO TABS: 100.0000 mg | ORAL_TABLET | Freq: Two times a day (BID) | ORAL | 0 refills | Status: DC

## 2024-10-07 NOTE — Telephone Encounter (Signed)
 Called and given below message. She verbalized understanding.

## 2024-10-22 ENCOUNTER — Encounter: Payer: Self-pay | Admitting: Hematology and Oncology

## 2024-10-22 ENCOUNTER — Inpatient Hospital Stay: Attending: Hematology and Oncology

## 2024-10-22 ENCOUNTER — Inpatient Hospital Stay

## 2024-10-22 ENCOUNTER — Inpatient Hospital Stay: Admitting: Hematology and Oncology

## 2024-10-22 VITALS — BP 113/87 | HR 98 | Temp 98.3°F | Resp 18 | Wt 126.4 lb

## 2024-10-22 DIAGNOSIS — C549 Malignant neoplasm of corpus uteri, unspecified: Secondary | ICD-10-CM

## 2024-10-22 DIAGNOSIS — D696 Thrombocytopenia, unspecified: Secondary | ICD-10-CM | POA: Diagnosis not present

## 2024-10-22 DIAGNOSIS — C787 Secondary malignant neoplasm of liver and intrahepatic bile duct: Secondary | ICD-10-CM | POA: Diagnosis not present

## 2024-10-22 DIAGNOSIS — C7911 Secondary malignant neoplasm of bladder: Secondary | ICD-10-CM | POA: Diagnosis not present

## 2024-10-22 DIAGNOSIS — C786 Secondary malignant neoplasm of retroperitoneum and peritoneum: Secondary | ICD-10-CM | POA: Diagnosis not present

## 2024-10-22 DIAGNOSIS — C7889 Secondary malignant neoplasm of other digestive organs: Secondary | ICD-10-CM | POA: Insufficient documentation

## 2024-10-22 DIAGNOSIS — Z5112 Encounter for antineoplastic immunotherapy: Secondary | ICD-10-CM | POA: Diagnosis present

## 2024-10-22 DIAGNOSIS — C541 Malignant neoplasm of endometrium: Secondary | ICD-10-CM | POA: Diagnosis not present

## 2024-10-22 LAB — CMP (CANCER CENTER ONLY)
ALT: 16 U/L (ref 0–44)
AST: 17 U/L (ref 15–41)
Albumin: 4.5 g/dL (ref 3.5–5.0)
Alkaline Phosphatase: 42 U/L (ref 38–126)
Anion gap: 7 (ref 5–15)
BUN: 14 mg/dL (ref 6–20)
CO2: 27 mmol/L (ref 22–32)
Calcium: 9.6 mg/dL (ref 8.9–10.3)
Chloride: 106 mmol/L (ref 98–111)
Creatinine: 0.67 mg/dL (ref 0.44–1.00)
GFR, Estimated: >60 mL/min (ref >60)
Glucose, Bld: 99 mg/dL (ref 70–99)
Potassium: 3.6 mmol/L (ref 3.5–5.1)
Sodium: 140 mmol/L (ref 135–145)
Total Bilirubin: 1.1 mg/dL (ref 0.0–1.2)
Total Protein: 7.5 g/dL (ref 6.5–8.1)

## 2024-10-22 LAB — CBC WITH DIFFERENTIAL (CANCER CENTER ONLY)
Abs Immature Granulocytes: 0 K/uL (ref 0.00–0.07)
Basophils Absolute: 0 K/uL (ref 0.0–0.1)
Basophils Relative: 0 %
Eosinophils Absolute: 0 K/uL (ref 0.0–0.5)
Eosinophils Relative: 0 %
HCT: 39.6 % (ref 36.0–46.0)
Hemoglobin: 13.7 g/dL (ref 12.0–15.0)
Immature Granulocytes: 0 %
Lymphocytes Relative: 50 %
Lymphs Abs: 2.8 K/uL (ref 0.7–4.0)
MCH: 29.3 pg (ref 26.0–34.0)
MCHC: 34.6 g/dL (ref 30.0–36.0)
MCV: 84.6 fL (ref 80.0–100.0)
Monocytes Absolute: 0.4 K/uL (ref 0.1–1.0)
Monocytes Relative: 7 %
Neutro Abs: 2.4 K/uL (ref 1.7–7.7)
Neutrophils Relative %: 43 %
Platelet Count: 130 K/uL — ABNORMAL LOW (ref 150–400)
RBC: 4.68 MIL/uL (ref 3.87–5.11)
RDW: 14.5 % (ref 11.5–15.5)
WBC Count: 5.6 K/uL (ref 4.0–10.5)
nRBC: 0 % (ref 0.0–0.2)

## 2024-10-22 MED ORDER — SODIUM CHLORIDE 0.9 % IV SOLN: INTRAVENOUS | Status: DC

## 2024-10-22 MED ORDER — SODIUM CHLORIDE 0.9 % IV SOLN
200.0000 mg | Freq: Once | INTRAVENOUS | Status: AC
  Administered 2024-10-22: 200 mg via INTRAVENOUS
  Filled 2024-10-22: qty 200

## 2024-10-22 NOTE — Patient Instructions (Signed)

## 2024-10-22 NOTE — Assessment & Plan Note (Addendum)
This is likely due to recent treatment. The patient denies recent history of bleeding such as epistaxis, hematuria or hematochezia. She is asymptomatic from the low platelet count. I will observe for now.  

## 2024-10-22 NOTE — Assessment & Plan Note (Addendum)
 The patient was originally diagnosed with uterine cancer in 2020 and subsequently lost to follow-up She was subsequently evaluated and treated in [place] , records were reviewed and summarized in the oncologic history.  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 p53 stain shows mutant pattern staining.  MSI stable, MMR intact, foundation One: Low tumor mutation burden, HRD negative.  Genetic testing from January 2025 was negative  She received first cycle of combination of pembrolizumab  and lenvatinib  on July 30, 2024 which she tolerated well.  Dose of lenvatinib  was subsequently reduced due to hand-foot syndrome CT imaging in September showed no significant changes/active disease She will continue treatment as scheduled She tolerated reduced dose lenvatinib  well I plan to repeat imaging study in January

## 2024-10-22 NOTE — Progress Notes (Signed)
 [facility] OFFICE PROGRESS NOTE  Patient Care Team: Nedra Tinnie LABOR, NP as PCP - General (Internal Medicine) Lonn Hicks, MD as Consulting Physician (Hematology and Oncology)  Assessment & Plan Malignant neoplasm of body of uterus, unspecified site [facility]) The patient was originally diagnosed with uterine cancer in 2020 and subsequently lost to follow-up She was subsequently evaluated and treated in [place] , records were reviewed and summarized in the oncologic history.  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 p53 stain shows mutant pattern staining.  MSI stable, MMR intact, foundation One: Low tumor mutation burden, HRD negative.  Genetic testing from January 2025 was negative  She received first cycle of combination of pembrolizumab  and lenvatinib  on July 30, 2024 which she tolerated well.  Dose of lenvatinib  was subsequently reduced due to hand-foot syndrome CT imaging in September showed no significant changes/active disease She will continue treatment as scheduled She tolerated reduced dose lenvatinib  well I plan to repeat imaging study in January Thrombocytopenia This is likely due to recent treatment. The patient denies recent history of bleeding such as epistaxis, hematuria or hematochezia. She is asymptomatic from the low platelet count. I will observe for now.    No orders of the defined types were placed in this encounter.    Hicks Lonn, MD  INTERVAL HISTORY: she returns for treatment follow-up Complications related to previous cycle of chemotherapy included  thrombocytopenia,  PHYSICAL EXAMINATION: ECOG PERFORMANCE STATUS: 0 - Asymptomatic  Lab Results  Component Value Date   CAN125 4.3 08/20/2024      Latest Ref Rng & Units 10/22/2024   11:31 AM 10/01/2024   11:25 AM 09/10/2024    1:05 PM  CBC  WBC 4.0 - 10.5 K/uL 5.6  5.3  5.1   Hemoglobin 12.0 - 15.0 g/dL 86.2  85.7  86.6   Hematocrit 36.0 - 46.0 % 39.6  41.3  40.0   Platelets 150 - 400 K/uL 130  132  149       Chemistry      Component Value Date/Time   NA 140 10/22/2024 1131   K 3.6 10/22/2024 1131   CL 106 10/22/2024 1131   CO2 27 10/22/2024 1131   BUN 14 10/22/2024 1131   CREATININE 0.67 10/22/2024 1131      Component Value Date/Time   CALCIUM 9.6 10/22/2024 1131   ALKPHOS 42 10/22/2024 1131   AST 17 10/22/2024 1131   ALT 16 10/22/2024 1131   BILITOT 1.1 10/22/2024 1131       Vitals:   10/22/24 1207  BP: 113/87  Pulse: 98  Resp: 18  Temp: 98.3 F (36.8 C)  SpO2: 100%   Filed Weights   10/22/24 1207  Weight: 126 lb 6.4 oz (57.3 kg)   Other relevant data reviewed during this visit included CBC and CMP

## 2024-11-05 ENCOUNTER — Encounter: Payer: Self-pay | Admitting: Hematology and Oncology

## 2024-11-19 ENCOUNTER — Inpatient Hospital Stay

## 2024-11-19 ENCOUNTER — Inpatient Hospital Stay: Admitting: Hematology and Oncology

## 2024-11-19 ENCOUNTER — Inpatient Hospital Stay: Attending: Hematology and Oncology

## 2024-11-19 ENCOUNTER — Encounter: Payer: Self-pay | Admitting: Hematology and Oncology

## 2024-11-19 VITALS — BP 113/86 | HR 99 | Temp 98.6°F | Resp 16 | Ht 62.0 in | Wt 127.2 lb

## 2024-11-19 DIAGNOSIS — Z5112 Encounter for antineoplastic immunotherapy: Secondary | ICD-10-CM | POA: Insufficient documentation

## 2024-11-19 DIAGNOSIS — Z170 Estrogen receptor positive status [ER+]: Secondary | ICD-10-CM | POA: Insufficient documentation

## 2024-11-19 DIAGNOSIS — L271 Localized skin eruption due to drugs and medicaments taken internally: Secondary | ICD-10-CM

## 2024-11-19 DIAGNOSIS — C541 Malignant neoplasm of endometrium: Secondary | ICD-10-CM | POA: Insufficient documentation

## 2024-11-19 DIAGNOSIS — C549 Malignant neoplasm of corpus uteri, unspecified: Secondary | ICD-10-CM

## 2024-11-19 LAB — TSH: TSH: 2.1 u[IU]/mL (ref 0.350–4.500)

## 2024-11-19 LAB — CMP (CANCER CENTER ONLY)
ALT: 20 U/L (ref 0–44)
AST: 24 U/L (ref 15–41)
Albumin: 4.9 g/dL (ref 3.5–5.0)
Alkaline Phosphatase: 46 U/L (ref 38–126)
Anion gap: 12 (ref 5–15)
BUN: 12 mg/dL (ref 6–20)
CO2: 25 mmol/L (ref 22–32)
Calcium: 9.9 mg/dL (ref 8.9–10.3)
Chloride: 104 mmol/L (ref 98–111)
Creatinine: 0.62 mg/dL (ref 0.44–1.00)
GFR, Estimated: >60 mL/min (ref >60)
Glucose, Bld: 92 mg/dL (ref 70–99)
Potassium: 3.7 mmol/L (ref 3.5–5.1)
Sodium: 140 mmol/L (ref 135–145)
Total Bilirubin: 1.3 mg/dL — ABNORMAL HIGH (ref 0.0–1.2)
Total Protein: 8 g/dL (ref 6.5–8.1)

## 2024-11-19 LAB — CBC WITH DIFFERENTIAL (CANCER CENTER ONLY)
Abs Immature Granulocytes: 0 K/uL (ref 0.00–0.07)
Basophils Absolute: 0 K/uL (ref 0.0–0.1)
Basophils Relative: 0 %
Eosinophils Absolute: 0 K/uL (ref 0.0–0.5)
Eosinophils Relative: 0 %
HCT: 39.2 % (ref 36.0–46.0)
Hemoglobin: 13.8 g/dL (ref 12.0–15.0)
Immature Granulocytes: 0 %
Lymphocytes Relative: 44 %
Lymphs Abs: 2 K/uL (ref 0.7–4.0)
MCH: 29.8 pg (ref 26.0–34.0)
MCHC: 35.2 g/dL (ref 30.0–36.0)
MCV: 84.7 fL (ref 80.0–100.0)
Monocytes Absolute: 0.3 K/uL (ref 0.1–1.0)
Monocytes Relative: 7 %
Neutro Abs: 2.3 K/uL (ref 1.7–7.7)
Neutrophils Relative %: 49 %
Platelet Count: 161 K/uL (ref 150–400)
RBC: 4.63 MIL/uL (ref 3.87–5.11)
RDW: 14 % (ref 11.5–15.5)
WBC Count: 4.7 K/uL (ref 4.0–10.5)
nRBC: 0 % (ref 0.0–0.2)

## 2024-11-19 MED ORDER — LIDOCAINE-PRILOCAINE 2.5-2.5 % EX CREA: TOPICAL_CREAM | CUTANEOUS | 3 refills | Status: DC

## 2024-11-19 MED ORDER — SODIUM CHLORIDE 0.9 % IV SOLN
200.0000 mg | Freq: Once | INTRAVENOUS | Status: AC
  Administered 2024-11-19: 200 mg via INTRAVENOUS
  Filled 2024-11-19: qty 200

## 2024-11-19 MED ORDER — SODIUM CHLORIDE 0.9 % IV SOLN: INTRAVENOUS | Status: DC

## 2024-11-19 NOTE — Progress Notes (Signed)
 [facility] OFFICE PROGRESS NOTE  Patient Care Team: Nedra Tinnie LABOR, NP as PCP - General (Internal Medicine) Lonn Hicks, MD as Consulting Physician (Hematology and Oncology)  Assessment & Plan Malignant neoplasm of body of uterus, unspecified site [facility]) The patient was originally diagnosed with uterine cancer in 2020 and subsequently lost to follow-up She was subsequently evaluated and treated in [place] , records were reviewed and summarized in the oncologic history.  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 p53 stain shows mutant pattern staining.  MSI stable, MMR intact, foundation One: Low tumor mutation burden, HRD negative.  Genetic testing from January 2025 was negative  She received first cycle of combination of pembrolizumab  and lenvatinib  on July 30, 2024 which she tolerated well.  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    Hicks Lonn, MD  INTERVAL HISTORY: she returns for treatment follow-up Complications related to previous cycle of  chemotherapy included recurrent hand-foot syndrome After taking several days of break from lenvatinib , her symptoms has resolved  PHYSICAL EXAMINATION: ECOG PERFORMANCE STATUS: 0 - Asymptomatic  Lab Results  Component Value Date   CAN125 4.3 08/20/2024      Latest Ref Rng & Units 11/19/2024   10:40 AM 10/22/2024   11:31 AM 10/01/2024   11:25 AM  CBC  WBC 4.0 - 10.5 K/uL 4.7  5.6  5.3   Hemoglobin 12.0 - 15.0 g/dL 86.1  86.2  85.7   Hematocrit 36.0 - 46.0 % 39.2  39.6  41.3   Platelets 150 - 400 K/uL 161  130  132       Chemistry      Component Value Date/Time   NA 140 11/19/2024 1040   K 3.7 11/19/2024 1040   CL 104 11/19/2024 1040   CO2 25 11/19/2024 1040   BUN 12 11/19/2024 1040   CREATININE 0.62 11/19/2024 1040      Component Value Date/Time   CALCIUM 9.9 11/19/2024 1040   ALKPHOS 46 11/19/2024 1040   AST 24 11/19/2024 1040   ALT 20 11/19/2024 1040   BILITOT 1.3 (H) 11/19/2024 1040       There were no vitals filed for this visit. There were no vitals filed for this visit. Other relevant data reviewed during this visit included CBC and CMP

## 2024-11-19 NOTE — Assessment & Plan Note (Addendum)
 She has intermittent recurrent hand-foot syndrome I recommend topical emollient

## 2024-11-19 NOTE — Patient Instructions (Signed)

## 2024-11-19 NOTE — Assessment & Plan Note (Addendum)
 The patient was originally diagnosed with uterine cancer in 2020 and subsequently lost to follow-up She was subsequently evaluated and treated in [place] , records were reviewed and summarized in the oncologic history.  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 p53 stain shows mutant pattern staining.  MSI stable, MMR intact, foundation One: Low tumor mutation burden, HRD negative.  Genetic testing from January 2025 was negative  She received first cycle of combination of pembrolizumab  and lenvatinib  on July 30, 2024 which she tolerated well.  Dose of lenvatinib  was subsequently reduced due to hand-foot syndrome CT imaging in September showed no significant changes/active disease She will continue treatment as scheduled She tolerated reduced dose lenvatinib  well Her recent treatment was complicated by another symptom of hand-foot syndrome but that has since resolved We will proceed with treatment as scheduled I plan to repeat imaging study in January

## 2024-11-20 LAB — T4: T4, Total: 7.7 ug/dL (ref 4.5–12.0)

## 2024-12-20 ENCOUNTER — Encounter: Payer: Self-pay | Admitting: Hematology and Oncology

## 2024-12-20 ENCOUNTER — Inpatient Hospital Stay: Attending: Hematology and Oncology

## 2024-12-20 ENCOUNTER — Inpatient Hospital Stay: Admitting: Hematology and Oncology

## 2024-12-20 ENCOUNTER — Inpatient Hospital Stay

## 2024-12-20 ENCOUNTER — Other Ambulatory Visit: Payer: Self-pay | Admitting: Hematology and Oncology

## 2024-12-20 VITALS — BP 120/76 | HR 94 | Temp 97.4°F | Resp 18 | Ht 62.0 in | Wt 127.2 lb

## 2024-12-20 DIAGNOSIS — Z9079 Acquired absence of other genital organ(s): Secondary | ICD-10-CM | POA: Insufficient documentation

## 2024-12-20 DIAGNOSIS — C549 Malignant neoplasm of corpus uteri, unspecified: Secondary | ICD-10-CM

## 2024-12-20 DIAGNOSIS — C7911 Secondary malignant neoplasm of bladder: Secondary | ICD-10-CM | POA: Insufficient documentation

## 2024-12-20 DIAGNOSIS — Z90722 Acquired absence of ovaries, bilateral: Secondary | ICD-10-CM | POA: Diagnosis not present

## 2024-12-20 DIAGNOSIS — Z9221 Personal history of antineoplastic chemotherapy: Secondary | ICD-10-CM | POA: Insufficient documentation

## 2024-12-20 DIAGNOSIS — C787 Secondary malignant neoplasm of liver and intrahepatic bile duct: Secondary | ICD-10-CM | POA: Diagnosis not present

## 2024-12-20 DIAGNOSIS — E8940 Asymptomatic postprocedural ovarian failure: Secondary | ICD-10-CM | POA: Insufficient documentation

## 2024-12-20 DIAGNOSIS — Z90710 Acquired absence of both cervix and uterus: Secondary | ICD-10-CM | POA: Insufficient documentation

## 2024-12-20 DIAGNOSIS — C7889 Secondary malignant neoplasm of other digestive organs: Secondary | ICD-10-CM | POA: Insufficient documentation

## 2024-12-20 DIAGNOSIS — C541 Malignant neoplasm of endometrium: Secondary | ICD-10-CM | POA: Diagnosis present

## 2024-12-20 DIAGNOSIS — Z5112 Encounter for antineoplastic immunotherapy: Secondary | ICD-10-CM | POA: Diagnosis present

## 2024-12-20 DIAGNOSIS — C786 Secondary malignant neoplasm of retroperitoneum and peritoneum: Secondary | ICD-10-CM | POA: Diagnosis not present

## 2024-12-20 LAB — CMP (CANCER CENTER ONLY)
ALT: 23 U/L (ref 0–44)
AST: 24 U/L (ref 15–41)
Albumin: 4.8 g/dL (ref 3.5–5.0)
Alkaline Phosphatase: 44 U/L (ref 38–126)
Anion gap: 12 (ref 5–15)
BUN: 17 mg/dL (ref 6–20)
CO2: 24 mmol/L (ref 22–32)
Calcium: 9.6 mg/dL (ref 8.9–10.3)
Chloride: 103 mmol/L (ref 98–111)
Creatinine: 0.66 mg/dL (ref 0.44–1.00)
GFR, Estimated: >60 mL/min
Glucose, Bld: 88 mg/dL (ref 70–99)
Potassium: 3.7 mmol/L (ref 3.5–5.1)
Sodium: 140 mmol/L (ref 135–145)
Total Bilirubin: 2.5 mg/dL — ABNORMAL HIGH (ref 0.0–1.2)
Total Protein: 7.7 g/dL (ref 6.5–8.1)

## 2024-12-20 LAB — CBC WITH DIFFERENTIAL (CANCER CENTER ONLY)
Abs Immature Granulocytes: 0.02 K/uL (ref 0.00–0.07)
Basophils Absolute: 0 K/uL (ref 0.0–0.1)
Basophils Relative: 0 %
Eosinophils Absolute: 0 K/uL (ref 0.0–0.5)
Eosinophils Relative: 0 %
HCT: 38.5 % (ref 36.0–46.0)
Hemoglobin: 13.2 g/dL (ref 12.0–15.0)
Immature Granulocytes: 0 %
Lymphocytes Relative: 44 %
Lymphs Abs: 2.8 K/uL (ref 0.7–4.0)
MCH: 30 pg (ref 26.0–34.0)
MCHC: 34.3 g/dL (ref 30.0–36.0)
MCV: 87.5 fL (ref 80.0–100.0)
Monocytes Absolute: 0.4 K/uL (ref 0.1–1.0)
Monocytes Relative: 7 %
Neutro Abs: 3.1 K/uL (ref 1.7–7.7)
Neutrophils Relative %: 49 %
Platelet Count: 173 K/uL (ref 150–400)
RBC: 4.4 MIL/uL (ref 3.87–5.11)
RDW: 14.1 % (ref 11.5–15.5)
WBC Count: 6.3 K/uL (ref 4.0–10.5)
nRBC: 0 % (ref 0.0–0.2)

## 2024-12-20 MED ORDER — SODIUM CHLORIDE 0.9 % IV SOLN
200.0000 mg | Freq: Once | INTRAVENOUS | Status: AC
  Administered 2024-12-20: 200 mg via INTRAVENOUS
  Filled 2024-12-20: qty 200

## 2024-12-20 MED ORDER — LIDOCAINE-PRILOCAINE 2.5-2.5 % EX CREA: TOPICAL_CREAM | CUTANEOUS | 3 refills | Status: AC

## 2024-12-20 MED ORDER — SODIUM CHLORIDE 0.9 % IV SOLN: INTRAVENOUS | Status: DC

## 2024-12-20 NOTE — Progress Notes (Signed)
 [facility] OFFICE PROGRESS NOTE  Patient Care Team: Nedra Tinnie LABOR, NP as PCP - General (Internal Medicine) Lonn Hicks, MD as Consulting Physician (Hematology and Oncology)  Assessment & Plan Malignant neoplasm of body of uterus, unspecified site [facility]) The patient was originally diagnosed with uterine cancer in 2020 and subsequently lost to follow-up She was subsequently evaluated and treated in [place] , records were reviewed and summarized in the oncologic history.  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 p53 stain shows mutant pattern staining.  MSI stable, MMR intact, foundation One: Low tumor mutation burden, HRD negative.  Genetic testing from January 2025 was negative  She received first cycle of combination of pembrolizumab  and lenvatinib  on July 30, 2024 which she tolerated well.  Dose of lenvatinib  was subsequently reduced due to hand-foot syndrome CT imaging in September showed no significant changes/active disease She will continue treatment as scheduled She tolerated reduced dose lenvatinib  well Her recent treatment was complicated by another symptom of hand-foot syndrome but that has since resolved We will proceed with treatment as scheduled I plan to repeat imaging study in January  Orders Placed This Encounter  Procedures   CT CHEST ABDOMEN PELVIS W CONTRAST    Standing Status:   Future    Expected Date:   01/03/2025    Expiration Date:   12/20/2025    If indicated for the ordered procedure, I authorize the administration of contrast media per Radiology  protocol:   Yes    Does the patient have a contrast media/X-ray dye allergy?:   No    Is patient pregnant?:   No    Preferred imaging location?:   [facility]    If indicated for the ordered procedure, I authorize the administration of oral contrast media per Radiology protocol:   No    Reason for no oral contrast::   no need oral contrast     Hicks Lonn, MD  INTERVAL HISTORY: she returns for treatment follow-up Complications related to previous cycle of chemotherapy included none  PHYSICAL EXAMINATION: ECOG PERFORMANCE STATUS: 0 - Asymptomatic  Lab Results  Component Value Date   CAN125 4.3 08/20/2024      Latest Ref Rng & Units 12/20/2024   11:53 AM 11/19/2024   10:40 AM 10/22/2024   11:31 AM  CBC  WBC 4.0 - 10.5 K/uL 6.3  4.7  5.6   Hemoglobin 12.0 - 15.0 g/dL 86.7  86.1  86.2   Hematocrit 36.0 - 46.0 % 38.5  39.2  39.6   Platelets 150 - 400 K/uL 173  161  130       Chemistry      Component Value Date/Time   NA 140 12/20/2024 1153   K 3.7 12/20/2024 1153   CL 103 12/20/2024 1153   CO2 24 12/20/2024 1153   BUN 17 12/20/2024 1153   CREATININE 0.66 12/20/2024 1153      Component Value Date/Time   CALCIUM 9.6 12/20/2024 1153   ALKPHOS 44 12/20/2024 1153   AST 24 12/20/2024 1153   ALT 23 12/20/2024 1153   BILITOT 2.5 (H) 12/20/2024 1153       Vitals:   12/20/24 1218  BP: 120/76  Pulse: 94  Resp: 18  Temp: (!) 97.4 F (36.3 C)  SpO2: 100%   Filed Weights   12/20/24 1218  Weight: 127 lb 3.2 oz (57.7 kg)   Other relevant data reviewed during this visit included CBC and CMP

## 2024-12-20 NOTE — Patient Instructions (Signed)

## 2024-12-20 NOTE — Assessment & Plan Note (Addendum)
 The patient was originally diagnosed with uterine cancer in 2020 and subsequently lost to follow-up She was subsequently evaluated and treated in [place] , records were reviewed and summarized in the oncologic history.  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 p53 stain shows mutant pattern staining.  MSI stable, MMR intact, foundation One: Low tumor mutation burden, HRD negative.  Genetic testing from January 2025 was negative  She received first cycle of combination of pembrolizumab  and lenvatinib  on July 30, 2024 which she tolerated well.  Dose of lenvatinib  was subsequently reduced due to hand-foot syndrome CT imaging in September showed no significant changes/active disease She will continue treatment as scheduled She tolerated reduced dose lenvatinib  well Her recent treatment was complicated by another symptom of hand-foot syndrome but that has since resolved We will proceed with treatment as scheduled I plan to repeat imaging study in January

## 2024-12-21 ENCOUNTER — Other Ambulatory Visit: Payer: Self-pay

## 2024-12-30 ENCOUNTER — Other Ambulatory Visit: Payer: Self-pay

## 2025-01-03 ENCOUNTER — Ambulatory Visit (HOSPITAL_COMMUNITY)
Admission: RE | Admit: 2025-01-03 | Discharge: 2025-01-03 | Disposition: A | Discharge status: Home / Self Care | Source: Ambulatory Visit | Attending: Hematology and Oncology | Admitting: Hematology and Oncology

## 2025-01-03 DIAGNOSIS — C549 Malignant neoplasm of corpus uteri, unspecified: Secondary | ICD-10-CM | POA: Diagnosis present

## 2025-01-03 MED ORDER — IOHEXOL 300 MG/ML  SOLN
100.0000 mL | Freq: Once | INTRAMUSCULAR | Status: AC | PRN
  Administered 2025-01-03: 100 mL via INTRAVENOUS

## 2025-01-07 ENCOUNTER — Inpatient Hospital Stay: Admitting: Gynecologic Oncology

## 2025-01-07 ENCOUNTER — Encounter: Payer: Self-pay | Admitting: Gynecologic Oncology

## 2025-01-07 VITALS — BP 115/82 | HR 104 | Temp 97.6°F | Resp 20 | Ht 62.0 in | Wt 123.0 lb

## 2025-01-07 DIAGNOSIS — Z5112 Encounter for antineoplastic immunotherapy: Secondary | ICD-10-CM | POA: Diagnosis not present

## 2025-01-07 DIAGNOSIS — C541 Malignant neoplasm of endometrium: Secondary | ICD-10-CM

## 2025-01-07 DIAGNOSIS — E8940 Asymptomatic postprocedural ovarian failure: Secondary | ICD-10-CM

## 2025-01-07 NOTE — Patient Instructions (Addendum)
 It was good to see you today.  I do not see or feel any evidence of cancer recurrence on your exam.  I will see you for follow-up in 4 months.  Please let me know if you are interested in trying anything for hot flashes between now and your next follow-up visit with me.  As always, if you develop any new and concerning symptoms before your next visit, please call to see me sooner.

## 2025-01-07 NOTE — Progress Notes (Signed)
 Gynecologic Oncology Return Clinic Visit  01/07/25  Reason for Visit: follow-up  Treatment History: Oncology History Overview Note  Original path: adenocarcinoma, IHC MMR intact ER +, PR +, patchy p16 +, HR HPV negative  Negative [facility] CancerNext+RNAinsight Panel.  Report date is 01/12/2024.   The [facility] CancerNext+RNAinsight Panel includes sequencing, rearrangement analysis, and RNA analysis for the following 39 genes: APC, ATM, BAP1, BARD1, BMPR1A, BRCA1, BRCA2, BRIP1, CDH1, CDKN2A, CHEK2, FH, FLCN, MET, MLH1, MSH2, MSH6, MUTYH, NF1, NTHL1, PALB2, PMS2, PTEN, RAD51C, RAD51D, SMAD4, STK11, TP53, TSC1, TSC2, and VHL (sequencing and deletion/duplication); AXIN2, HOXB13, MBD4, MSH3, POLD1 and POLE (sequencing only); EPCAM and GREM1 (deletion/duplication only).   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  Bilateral ovaries with polycystic ovary appearance.   10/09/2019 Initial Biopsy   EMB: CAH, suspicious for [facility]   10/15/2019 Pathology Results   EMB: Grade 1 EMCA   11/02/2019 Initial Diagnosis   Malignant neoplasm of endometrium (HCC)   11/09/2019 Imaging   MRI pelvis: no evidence of myometrial invasion   11/22/2019 Surgery   D&C, Delmar IUD placement Pathology: focal residual EMCA with treatment effect   06/08/2020 Surgery   D&C, Mirena IUD replacement Pathology: scant pieces of benign endometrium with treatment effect   06/29/2021 Pathology Results   EMB: Polypoid fragments of inactive endometrium with hormone effect.  - Benign squamous cells.  - No hyperplasia or malignancy.   01/12/2024 Genetic Testing   Negative [facility] CancerNext+RNAinsight  Panel.  Report date is 01/12/2024.   The [facility] CancerNext+RNAinsight Panel includes sequencing, rearrangement analysis, and RNA analysis for the following 39 genes: APC, ATM, BAP1, BARD1, BMPR1A, BRCA1, BRCA2, BRIP1, CDH1, CDKN2A, CHEK2, FH, FLCN, MET, MLH1, MSH2, MSH6, MUTYH, NF1, NTHL1, PALB2, PMS2, PTEN, RAD51C, RAD51D, SMAD4, STK11, TP53, TSC1, TSC2, and VHL (sequencing and deletion/duplication); AXIN2, HOXB13, MBD4, MSH3, POLD1 and POLE (sequencing only); EPCAM and GREM1 (deletion/duplication only).    01/16/2024 - 05/07/2024 Chemotherapy   She received 7 cycles of neoadjuvant chemotherapy with carboplatin, paclitaxel and dostarlimab   01/28/2024 Imaging   1.  No evidence of new primary or metastatic neoplasia in the chest   2.  Soft tissue scalloping along the liver border incompletely imaged in the  upper abdomen, consider follow-up abdomen/pelvis CT to further interrogate if  deemed clinically appropriate to assess for evidence of peritoneal tumor  implants    03/21/2024 Imaging   1.  Peritoneal disease in the abdomen and pelvis as described with multiple  discrete and confluent peritoneal deposits in the perihepatic region and in  the pelvis as described  2.  Masslike density in the right adnexa not seen separately from the right  ovary could represent a primary malignancy or a metastatic deposit. Discrete  and confluent peritoneal deposits in the pelvis and superior to the bladder  with trace pelvic free fluid  3.  Slightly prominent noticeable mesenteric and retroperitoneal nodes with  prominent to enlarged periportal lymph nodes which are indeterminate and could be metastatic. Continued follow-up is recommended.  4.  Hypodensity in the inferior right hepatic lobe is indeterminate but  concerning for a metastatic lesion  5.  Heterogeneous uterus with intrauterine device in place. Prominent adnexal  vascularity  6. No aggressive osseous lesions    03/21/2024 Imaging   1.  Slight  interval increase in size of small cardiophrenic and prevascular  lymph node, which are indeterminate. Attention on follow-up exam is  recommended.  2. No suspicious pulmonary nodules.    03/30/2024 Pathology Results   Pathologic Diagnosis   A. Perihepatic lesion, biopsy: Metastatic carcinoma, consistent with a mullerian origin   Comment: The patient has a known history of endometrioid adenocarcinoma, FIGO grade 1 ([id number]). Immunohistochemical staining of block A2 reveals tumor cells positive for CK7, SOX17, and ER, and negative for CK20 and PAX8. 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     03/30/2024 Pathology Results   PROCEDURE PERFORMED:  Ultrasound guided needle biopsy.   FINDINGS/TARGET:  Perihepatic liver lesion    06/01/2024 Imaging   1.  Worsening peritoneal metastasis with increase in size and number of  confluent peritoneal deposits in the perihepatic region and pelvis. Trace free  pelvic fluid.  2.  Increase in size of masslike density in the right adnexa which is not seen  separately from the right ovary likely relates to metastatic involvement.  3.  Unchanged slightly prominent mesenteric and retroperitoneal lymph nodes  with enlarged periportal lymph nodes are indeterminate. Continued follow-up is  recommended  4. Indeterminate hypodensity in the inferior right hepatic lobe is also  stable in size.  5.  Heterogeneous uterus with intrauterine device in place. Prominent adnexal  vascularity.  6. No aggressive osseous lesions..     06/22/2024 Surgery   Date of Surgery: 06/22/2024   Pre-operative Diagnosis:  Endometrial cancer [C54.1]  Post-operative Diagnosis:  Endometrial cancer [C54.1]  Procedure:  Partial right liver (segments 5-8) resection Partial right liver (segment 6) resection Partial left liver (segment 2) resection Partial left liver (segment 1)  resection Right liver wedge excision (segment 6) Partial right diaphragm resection Partial splenectomy Intraoperative liver ultrasound Right thoracostomy chest tube placement Resection of porta hepatis tumors <8cm Cholecystectomy  Surgeon:  Dr Jordan Cloyd   Assistant:  Dr Alfonso Galeazzi Dr Therisa Sanders Morbey  Clinical Note:  Christina Alvarez is a 30 y.o. female with a history of endometrial cancer with peritoneal metastases. She received systemic chemotherapy and presents today for cytoreductive surgery with Dr Lansing.   Findings: Extensive perihepatic disease which required complete mobilization of the liver, partial right diaphragm resection, and partial hepatectomy of superficial liver of at least 50% of the liver surface. All visualized disease resected.    06/22/2024 Pathology Results   A. Omentum, omentectomy: Metastatic adenocarcinoma, consistent with a mullerian origin  B. Perigastric nodule, excision: Metastatic adenocarcinoma, consistent with a mullerian origin  C. Peritoneal nodule, excision: Metastatic adenocarcinoma, consistent with a mullerian origin  D. Appendix, appendectomy: Metastatic adenocarcinoma, consistent with a mullerian origin  E. Pelvic nodule, excision: Metastatic adenocarcinoma, consistent with a mullerian origin  F. Right tube and ovary, salpingo-oophorectomy: Ovary and fallopian tube with deposits of metastatic adenocarcinoma, consistent with a mullerian origin Lymphovascular invasion identified  G. 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 Bladder  peritoneum, biopsy: Metastatic adenocarcinoma, consistent with a mullerian origin  I. Perirectal nodule, excision: Metastatic adenocarcinoma, consistent with a mullerian origin  J. Sigmoid mesentery mass, excision: Metastatic adenocarcinoma, consistent with a mullerian origin  K. Posterior cul-de-sac, biopsy: Metastatic adenocarcinoma, consistent with a mullerian origin  L. Right aortic lymph nodes, lymphadenectomy: 3 lymph nodes, negative for carcinoma   M. Anterior peritoneal nodule, excision: Metastatic adenocarcinoma, consistent with a mullerian origin  N. Round ligament nodule, excision: Metastatic adenocarcinoma, consistent with a mullerian origin  O. Caudate process tumors, excision: Metastatic adenocarcinoma, consistent with a mullerian origin  P. Gallbladder, cholecystectomy: Gallbladder with no significant pathologic change, negative for carcinoma  Q. Cystic lymph node, lymphadenectomy: 1 lymph node, negative for carcinoma   R. Caudate implant tumor, excision: Metastatic adenocarcinoma, consistent with a mullerian origin  S. Porta hepatis tumors, excision: Metastatic adenocarcinoma, consistent with a mullerian origin  T. Fissure tumor, excision: Metastatic adenocarcinoma, consistent with a mullerian origin  U. Liver, segment 2, partial hepatectomy: Metastatic adenocarcinoma, consistent with a mullerian origin  V. Right liver, partial hepatectomy: Metastatic adenocarcinoma, consistent with a mullerian origin  W. Central diaphragm implants, excision: Metastatic adenocarcinoma, consistent with a mullerian origin  X. Splenic nodule, excision: Metastatic adenocarcinoma, consistent with a mullerian origin   The case was reviewed at GYN pathology meeting, and the above diagnosis reflects our division consensus.  The endometrial tumor is classified as FIGO grade 2 endometrioid  adenocarcinoma. Multiple extrauterine sites, including the uterine serosa, adnexa, peritoneum, omentum, gastrointestinal mesentery, porta hepatis, diaphragm, spleen, and liver, contain metastatic deposits of mllerian adenocarcinoma. Metastatic deposits exhibit higher-grade morphology; however, molecular profiling demonstrates a consistent p53 mutant pattern across all sites. These findings suggest a diagnosis of primary endometrial origin with widespread metastatic spread.  Immunohistochemical stains performed on block A2 (omentum) show positive staining for AE1/AE3, EMA, SOX17, ER (40%, moderate intensity) while negative staining for PAX8. p53 stain shows mutant pattern staining.  Immunohistochemical stains performed on block G6 (endometrium) shows positive staining staining for ER (40%, moderate intensity) , PR (90%, strong intensity). p53 stain shows mutant pattern staining.  Mismatch Repair Protein (MMR) Nuclear Expression by IHC on block G8; serosal nodule: (present/intact) MLH1: Present PMS2: Present MSH2: Present MSH6: Present  IHC Interpretation: No loss of nuclear expression of MMR proteins: low probability of microsatellite instability-high (MSI-H)#  # There are exceptions to the above IHC interpretations. These results should not be considered in isolation, and clinical correlation with genetic counseling is recommended to assess the need for germline testing.     07/19/2024 3:53 PM EDT [facility]    Addendum On July 16, 2024, Dr. Edsel Host submitted an order for additional stains/tests HER2 IHC Quant and the tissue block(s) was retrieved from archive storage and submitted to the [facility] clinical histology laboratory for additional processing and staining.  HER2 Immunohistochemistry:  Result: Negative (Score 1+)  All controls show appropriate reactivity.  HER2 protein expression is evaluated using DESTINY-PanTumor02 trial ([id number])  enrollment criteria for Trastuzumab-Deruxtecan use. It is evaluated by manual quantitative immunohistochemistry on formalin-fixed, paraffin-embedded tissues, using clone [id number] (rabbit monoclonal, Ventana) on a Ventana auto-stainer. Membrane staining of tumor cells is evaluated and graded as follows:  Result Criteria for Surgical Specimens Criteria for Biopsy Specimens Negative (Score 0) No staining or membrane staining in less than 10% of tumor cells No staining in any tumor cells Negative (Score 1+) Faint/barely perceptible incomplete membrane staining in greater than or equal to 10% tumor cells Tumor cell cluster*  with a faint/barely perceptible membrane staining irrespective of percentage of positive tumor cells Equivocal (Score 2+) Weak to moderate, complete, basolateral or lateral membrane staining in greater than or equal to10% of tumor cells Tumor cell cluster* with a weak to moderate, complete, basolateral or lateral membrane staining irrespective of percentage of positive tumor cells Positive (Score 3+) Strong, complete, basolateral or lateral membrane staining in greater than or equal to 10% of tumor cells Tumor cell cluster* with a strong, complete, basolateral or lateral membrane staining irrespective of percentage of positive tumor cells         06/23/2024 Imaging   1.  No pulmonary embolus.  2.  Small pleural effusions with mild adjacent atelectasis.  3.  Trace anterior right pneumothorax, a chest tube is in place.  4.  Majority of mediastinal lymph nodes are stable, while there are a few that  appear increased in size, potentially reactive although indeterminate in the  setting of malignancy. Attention on follow-up imaging.  5.  Partially imaged postoperative changes in the upper abdomen.    07/30/2024 -  Chemotherapy   Patient is on Treatment Plan : UTERINE Lenvatinib  (20) D1-21 + Pembrolizumab  (200) D1 q21d     08/06/2024 Cancer Staging   Staging form: Corpus Uteri - Carcinoma and  Carcinosarcoma, AJCC 8th Edition and FIGO 2023 - Pathologic: FIGO Stage IVB (pT4, pN0, pM1) - Signed by Lonn Hicks, MD on 08/06/2024 Stage prefix: Initial diagnosis   08/13/2024 Imaging   CT CHEST ABDOMEN PELVIS W CONTRAST Result Date: 08/16/2024 CLINICAL DATA:  Metastatic uterine cancer, assess treatment response. * Tracking Code: BO * EXAM: CT CHEST, ABDOMEN, AND PELVIS WITH CONTRAST TECHNIQUE: Multidetector CT imaging of the chest, abdomen and pelvis was performed following the standard protocol during bolus administration of intravenous contrast. RADIATION DOSE REDUCTION: This exam was performed according to the departmental dose-optimization program which includes automated exposure control, adjustment of the mA and/or kV according to patient size and/or use of iterative reconstruction technique. CONTRAST:  OMNIPAQUE  IOHEXOL  300 MG/ML  SOLN COMPARISON:  CT January 06, 2024 FINDINGS: CT CHEST FINDINGS Cardiovascular: Right chest wall Port-A-Cath with gas in the subcutaneous tissues along the catheter tract and port site compatible with sequela of recent insertion. Normal caliber thoracic aorta. Normal size heart. No significant pericardial effusion/thickening. Mediastinum/Nodes: No suspicious thyroid  nodule. Feathery soft tissue in the anterior mediastinum which is crescentic and conforms to underlying vasculature. No suspicious thyroid  nodule. Increased size of a right pericardiophrenic lymph node now measuring 8 mm in short axis on image 41/2 previously 4 mm. Lungs/Pleura: No suspicious pulmonary nodules or masses. No pleural effusion. No pneumothorax. Musculoskeletal: No suspicious chest wall mass. No aggressive lytic or blastic lesion of bone. CT ABDOMEN PELVIS FINDINGS Hepatobiliary: Decreased size of the peritoneal metastasis along the hepatic capsule for instance an implant along the anterolateral aspect of the liver measures 12 x 5 mm on image 53/2 previously 2.8 x 1.2 cm. Ill-defined segment V  hepatic hypodensity measuring 4 mm on image 62/2 is unchanged. Gallbladder is nondistended or surgically absent. No biliary ductal dilation. Pancreas: No pancreatic ductal dilation or evidence of acute inflammation. Spleen: No splenomegaly. Adrenals/Urinary Tract: No suspicious adrenal nodule/mass. Kidneys demonstrate symmetric enhancement. Urinary bladder is unremarkable for degree of distension. Stomach/Bowel: Stomach is minimally distended. No pathologic dilation of small or large bowel. Vascular/Lymphatic: Normal caliber abdominal aorta. Smooth IVC contours. No pathologically enlarged abdominal or pelvic lymph nodes. Reproductive: Uterus is surgically absent. Diffuse thickening of the vagina. No  asymmetric nodular enhancement along the vaginal cuff. Other: Trace pelvic free fluid. Probable prior omentectomy. The previously identified peritoneal/omental implants in the pelvis in anterior abdominal wall are not confidently identified although evaluation is made difficult by the lack of intraluminal enteric contrast material. Musculoskeletal: No aggressive lytic or blastic lesion of bone. IMPRESSION: 1. Decreased size of the peritoneal metastasis along the hepatic capsule. 2. The previously identified peritoneal/omental implants in the pelvis and anterior abdominal wall are not confidently identified although evaluation is made difficult by the lack of intraluminal enteric contrast material. Consider follow-up surveillance/restaging imaging with oral and IV contrast medium. 3. Increased size of a right pericardiophrenic lymph node now measuring 8 mm in short axis previously 4 mm, nonspecific suggest attention on follow-up imaging. 4. Feathery soft tissue in the anterior mediastinum which is crescentic and conforms to underlying vasculature, favored to reflect thymic rebound. 5. Uterus is surgically absent, diffuse thickening of the vagina, nonspecific but possibly reflecting posttreatment/postsurgical change. No  asymmetric nodular enhancement along the vaginal cuff. Suggest attention on follow-up imaging. 6. Trace pelvic free fluid. Electronically Signed   By: Reyes Holder M.D.   On: 08/16/2024 05:58   IR IMAGING GUIDED PORT INSERTION Result Date: 08/13/2024 INDICATION: 29 year old with metastatic uterine cancer. EXAM: FLUOROSCOPIC AND ULTRASOUND GUIDED PLACEMENT OF A SUBCUTANEOUS PORT MEDICATIONS: Benadryl  25 mg ANESTHESIA/SEDATION: Moderate (conscious) sedation was employed during this procedure. A total of Versed  3 mg and fentanyl  100 mcg was administered intravenously at the order of the provider performing the procedure. Total intra-service moderate sedation time: 33 minutes. Patient's level of consciousness and vital signs were monitored continuously by radiology nurse throughout the procedure under the supervision of the provider performing the procedure. FLUOROSCOPY TIME:  Radiation Exposure Index (as provided by the fluoroscopic device): 1 mGy Kerma COMPLICATIONS: None immediate. PROCEDURE: The procedure, risks, benefits, and alternatives were explained to the patient. Questions regarding the procedure were encouraged and answered. The patient understands and consents to the procedure. Patient was placed supine on the interventional table. Ultrasound confirmed a patent right internal jugular vein. Ultrasound image was saved for documentation. The right chest and neck were cleaned with a skin antiseptic and a sterile drape was placed. Maximal barrier sterile technique was utilized including caps, mask, sterile gowns, sterile gloves, sterile drape, hand hygiene and skin antiseptic. The right neck was anesthetized with 1% lidocaine . Small incision was made in the right neck with a blade. Micropuncture set was placed in the right internal jugular vein with ultrasound guidance. The micropuncture wire was used for measurement purposes. The right chest was anesthetized with 1% lidocaine  with epinephrine . #15 blade  was used to make an incision and a subcutaneous port pocket was formed. 8 french Power Port was assembled. Subcutaneous tunnel was formed with a stiff tunneling device. The port catheter was brought through the subcutaneous tunnel. The port was placed in the subcutaneous pocket. The micropuncture set was exchanged for a peel-away sheath. The catheter was placed through the peel-away sheath and the tip was positioned at the superior cavoatrial junction. Catheter placement was confirmed with fluoroscopy. The port was accessed and flushed with heparinized saline. The port pocket was closed using two layers of absorbable sutures and Dermabond. The vein skin site was closed using a single layer of absorbable suture and Dermabond. Sterile dressings were applied. Patient tolerated the procedure well without an immediate complication. Ultrasound and fluoroscopic images were taken and saved for this procedure. IMPRESSION: Placement of a subcutaneous power-injectable port device. Catheter tip  at the superior cavoatrial junction. Electronically Signed   By: Juliene Balder M.D.   On: 08/13/2024 18:00      01/03/2025 Imaging   CT CHEST ABDOMEN PELVIS W CONTRAST Result Date: 01/04/2025 CLINICAL DATA:  Follow-up metastatic uterine carcinoma. Assess treatment response to chemotherapy. EXAM: CT CHEST, ABDOMEN, AND PELVIS WITH CONTRAST TECHNIQUE: Multidetector CT imaging of the chest, abdomen and pelvis was performed following the standard protocol during bolus administration of intravenous contrast. RADIATION DOSE REDUCTION: This exam was performed according to the departmental dose-optimization program which includes automated exposure control, adjustment of the mA and/or kV according to patient size and/or use of iterative reconstruction technique. CONTRAST:  OMNIPAQUE  IOHEXOL  300 MG/ML  SOLN COMPARISON:  08/13/2024 FINDINGS: CT CHEST FINDINGS Cardiovascular: No acute findings. Mediastinum/Lymph Nodes: No masses or  pathologically enlarged lymph nodes identified. Lungs/Pleura: No suspicious pulmonary nodules or masses identified. No evidence of infiltrate or pleural effusion. Musculoskeletal:  No suspicious bone lesions identified. CT ABDOMEN AND PELVIS FINDINGS Hepatobiliary: No intrahepatic masses identified. Stable soft tissue nodularity is seen along the capsular surface of the liver, largest measuring 8 mm in diameter on image 40/2. Prior cholecystectomy. No evidence of biliary obstruction. Pancreas:  No mass or inflammatory changes. Spleen:  Within normal limits in size and appearance. Adrenals/Urinary tract: No suspicious masses or hydronephrosis. Unremarkable unopacified urinary bladder. Stomach/Bowel: No evidence of obstruction, inflammatory process, or abnormal fluid collections. Vascular/Lymphatic: No pathologically enlarged lymph nodes identified. No acute vascular findings. Reproductive: Prior hysterectomy noted. Adnexal regions are unremarkable in appearance. No evidence of peritoneal soft tissue nodules or ascites. Other:  None. Musculoskeletal:  No suspicious bone lesions identified. IMPRESSION: Stable mild peritoneal nodularity in the perihepatic space. No new or progressive disease within the chest, abdomen, or pelvis. Electronically Signed   By: Norleen DELENA Kil M.D.   On: 01/04/2025 11:58       C7 pembro + lenvima  on 12/20/24  Interval History: Doing well.  Using Vaseline now and has not had further issues with HFS.  Denies any vaginal bleeding.  Reports good bowel function.  Denies any abdominal pain.  Past Medical/Surgical History: Past Medical History:  Diagnosis Date   Endometrial cancer (HCC) 11/18/2019   Dr. Lansing [place]  [facility]   Malignant neoplasm of endometrium [facility]) 11/02/2019   Formatting of this note is different from the original.  Referral Dr. Darian Kopp.      10/09/19: EMB showed comnplex atypical hyperplasia, suspicious for endometrioid carcinoma.  (Sent for outside consult).      10/15/19: EMB showed well differentiated endometrioid adenocarcinoma, FIGO gr 1.      OUTSIDE SLIDE REVIEW     A.  Endometrium, biopsy ([id number], Part A, 10/09/2019):   Microscopic foci     Past Surgical History:  Procedure Laterality Date   ABDOMINAL HYSTERECTOMY  06/22/2024   In [place]    APPENDECTOMY     CHOLECYSTECTOMY     DILATION AND CURETTAGE OF UTERUS     x2   IR IMAGING GUIDED PORT INSERTION  08/13/2024   IR PATIENT EVAL TECH 0-60 MINS  09/10/2024   IR RADIOLOGIST EVAL & MGMT  09/06/2024   IR RADIOLOGIST EVAL & MGMT  09/14/2024   IR RADIOLOGIST EVAL & MGMT  10/01/2024    Family History  Problem Relation Age of Onset   Colon cancer Mother        dx early 50s   Diabetes Father    Diabetes Paternal Grandmother    Ovarian cancer  Neg Hx    Breast cancer Neg Hx    Endometrial cancer Neg Hx    Prostate cancer Neg Hx    Pancreatic cancer Neg Hx     Social History   Socioeconomic History   Marital status: Single    Spouse name: Not on file   Number of children: Not on file   Years of education: Not on file   Highest education level: Not on file  Occupational History   Not on file  Tobacco Use   Smoking status: Never   Smokeless tobacco: Never  Vaping Use   Vaping status: Never Used  Substance and Sexual Activity   Alcohol use: Not Currently    Comment: weekends   Drug use: Never   Sexual activity: Yes    Birth control/protection: I.U.D.  Other Topics Concern   Not on file  Social History Narrative   Not on file   Social Drivers of Health   Tobacco Use: Low Risk (01/07/2025)   Patient History    Smoking Tobacco Use: Never    Smokeless Tobacco Use: Never    Passive Exposure: Not on file  Financial Resource Strain: Low Risk (08/20/2024)   Overall Financial Resource Strain (CARDIA)    Difficulty of Paying Living Expenses: Not hard at all  Food Insecurity: No Food Insecurity (08/20/2024)   Epic    Worried About Radiation Protection Practitioner of  Food in the Last Year: Never true    Ran Out of Food in the Last Year: Never true  Transportation Needs: No Transportation Needs (08/20/2024)   Epic    Lack of Transportation (Medical): No    Lack of Transportation (Non-Medical): No  Physical Activity: Not on file  Stress: Not on file  Social Connections: Not on file  Depression (PHQ2-9): Low Risk (10/01/2024)   Depression (PHQ2-9)    PHQ-2 Score: 0  Alcohol Screen: Not on file  Housing: Low Risk (08/20/2024)   Epic    Unable to Pay for Housing in the Last Year: No    Number of Times Moved in the Last Year: 0    Homeless in the Last Year: No  Utilities: Not At Risk (08/20/2024)   Epic    Threatened with loss of utilities: No  Health Literacy: Not on file    Current Medications: Current Medications[1]  Review of Systems: Denies appetite changes, fevers, chills, fatigue, unexplained weight changes. Denies hearing loss, neck lumps or masses, mouth sores, ringing in ears or voice changes. Denies cough or wheezing.  Denies shortness of breath. Denies chest pain or palpitations. Denies leg swelling. Denies abdominal distention, pain, blood in stools, constipation, diarrhea, nausea, vomiting, or early satiety. Denies pain with intercourse, dysuria, frequency, hematuria or incontinence. Denies hot flashes, pelvic pain, vaginal bleeding or vaginal discharge.   Denies joint pain, back pain or muscle pain/cramps. Denies itching, rash, or wounds. Denies dizziness, headaches, numbness or seizures. Denies swollen lymph nodes or glands, denies easy bruising or bleeding. Denies anxiety, depression, confusion, or decreased concentration.  Physical Exam: BP 115/82 (BP Location: Left Arm, Patient Position: Sitting)   Pulse (!) 104   Temp 97.6 F (36.4 C)   Resp 20   Ht 5' 2 (1.575 m)   Wt 123 lb (55.8 kg)   LMP  (LMP Unknown)   BMI 22.50 kg/m  General: Alert, oriented, no acute distress. HEENT: Posterior oropharynx clear, sclera  anicteric. Chest: Clear to auscultation bilaterally.  No wheezes or rhonchi. Cardiovascular: Heart rate in high 90s, regular  rhythm, no murmurs. Abdomen: soft, nontender.  Normoactive bowel sounds.  No masses or hepatosplenomegaly appreciated.  Well-healed scar. Extremities: Grossly normal range of motion.  Warm, well perfused.  No edema bilaterally. Skin: No rashes or lesions noted. Lymphatics: No cervical, supraclavicular, or inguinal adenopathy. GU: Normal appearing external genitalia without erythema, excoriation, or lesions.  Speculum exam reveals cuff intact, no vaginal lesions.  Bimanual exam reveals cuff intact, no masses or nodularity.    Laboratory & Radiologic Studies: CT C/A/P on 01/03/25: Stable mild peritoneal nodularity in the perihepatic space. No new or progressive disease within the chest, abdomen, or pelvis.  Assessment & Plan: Christina Alvarez is a 29 y.o. 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 Given tumor burden at surgery, adjuvant therapy was recommended and ultimately decision made to start pembro/lenvima  (07/2024). Treatment complicated by HFS required dose reduction of lenvima .  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 TMB low, HRD -.  HER2 neg (1+). Negative germline testing.    Overall doing well.  Now s/p 7 cycles of pembro and lenvima .   Discussed menopausal symptoms.  She remains not very bothered by them.  Discussed several nonhormonal options again today.  Her preference is to wait on any treatment at this time.  I have asked her to send a message or call if she is interested in starting something before her next visit with me.     I will see the patient for follow-up in 4 months.  20 minutes of total time was spent for this patient encounter, including preparation, face-to-face counseling with the patient and coordination of care,  and documentation of the encounter.  Comer Dollar, MD  Division of Gynecologic Oncology  Department of Obstetrics and Gynecology  [facility]      [1]  Current Outpatient Medications:    acetaminophen (TYLENOL) 325 MG tablet, Take 325 mg by mouth every 6 (six) hours as needed for moderate pain (pain score 4-6)., Disp: , Rfl:    lenvatinib  10 mg daily dose (LENVIMA ) capsule, Take 1 capsule (10 mg total) by mouth daily., Disp: 30 capsule, Rfl: 11   lidocaine -prilocaine  (EMLA ) cream, Apply to affected area once, Disp: 30 g, Rfl: 3   polyethylene glycol powder (GLYCOLAX/MIRALAX) 17 GM/SCOOP powder, Take 17 g by mouth daily., Disp: , Rfl:    prochlorperazine  (COMPAZINE ) 10 MG tablet, Take 1 tablet (10 mg total) by mouth every 6 (six) hours as needed., Disp: 30 tablet, Rfl: 3

## 2025-01-08 ENCOUNTER — Other Ambulatory Visit: Payer: Self-pay

## 2025-01-13 ENCOUNTER — Inpatient Hospital Stay

## 2025-01-13 ENCOUNTER — Encounter: Payer: Self-pay | Admitting: Hematology and Oncology

## 2025-01-13 ENCOUNTER — Inpatient Hospital Stay: Admitting: Hematology and Oncology

## 2025-01-13 VITALS — BP 106/81 | HR 88 | Temp 97.9°F | Resp 18

## 2025-01-13 DIAGNOSIS — C549 Malignant neoplasm of corpus uteri, unspecified: Secondary | ICD-10-CM | POA: Diagnosis not present

## 2025-01-13 DIAGNOSIS — R748 Abnormal levels of other serum enzymes: Secondary | ICD-10-CM

## 2025-01-13 DIAGNOSIS — Z5112 Encounter for antineoplastic immunotherapy: Secondary | ICD-10-CM | POA: Diagnosis not present

## 2025-01-13 LAB — CMP (CANCER CENTER ONLY)
ALT: 22 U/L (ref 0–44)
AST: 26 U/L (ref 15–41)
Albumin: 4.7 g/dL (ref 3.5–5.0)
Alkaline Phosphatase: 45 U/L (ref 38–126)
Anion gap: 13 (ref 5–15)
BUN: 14 mg/dL (ref 6–20)
CO2: 25 mmol/L (ref 22–32)
Calcium: 9.8 mg/dL (ref 8.9–10.3)
Chloride: 102 mmol/L (ref 98–111)
Creatinine: 0.69 mg/dL (ref 0.44–1.00)
GFR, Estimated: >60 mL/min
Glucose, Bld: 90 mg/dL (ref 70–99)
Potassium: 3.6 mmol/L (ref 3.5–5.1)
Sodium: 140 mmol/L (ref 135–145)
Total Bilirubin: 1.7 mg/dL — ABNORMAL HIGH (ref 0.0–1.2)
Total Protein: 7.5 g/dL (ref 6.5–8.1)

## 2025-01-13 LAB — CBC WITH DIFFERENTIAL (CANCER CENTER ONLY)
Abs Immature Granulocytes: 0.01 10*3/uL (ref 0.00–0.07)
Basophils Absolute: 0 10*3/uL (ref 0.0–0.1)
Basophils Relative: 0 %
Eosinophils Absolute: 0 10*3/uL (ref 0.0–0.5)
Eosinophils Relative: 0 %
HCT: 38.4 % (ref 36.0–46.0)
Hemoglobin: 13.1 g/dL (ref 12.0–15.0)
Immature Granulocytes: 0 %
Lymphocytes Relative: 41 %
Lymphs Abs: 2.7 10*3/uL (ref 0.7–4.0)
MCH: 30 pg (ref 26.0–34.0)
MCHC: 34.1 g/dL (ref 30.0–36.0)
MCV: 88.1 fL (ref 80.0–100.0)
Monocytes Absolute: 0.4 10*3/uL (ref 0.1–1.0)
Monocytes Relative: 6 %
Neutro Abs: 3.5 10*3/uL (ref 1.7–7.7)
Neutrophils Relative %: 53 %
Platelet Count: 175 10*3/uL (ref 150–400)
RBC: 4.36 MIL/uL (ref 3.87–5.11)
RDW: 13.5 % (ref 11.5–15.5)
WBC Count: 6.6 10*3/uL (ref 4.0–10.5)
nRBC: 0 % (ref 0.0–0.2)

## 2025-01-13 MED ORDER — SODIUM CHLORIDE 0.9 % IV SOLN: INTRAVENOUS | Status: DC

## 2025-01-13 MED ORDER — SODIUM CHLORIDE 0.9 % IV SOLN
200.0000 mg | Freq: Once | INTRAVENOUS | Status: AC
  Administered 2025-01-13: 200 mg via INTRAVENOUS
  Filled 2025-01-13: qty 200

## 2025-01-13 NOTE — Progress Notes (Signed)
 [facility] OFFICE PROGRESS NOTE  Patient Care Team: Nedra Tinnie LABOR, NP as PCP - General (Internal Medicine) Lonn Hicks, MD as Consulting Physician (Hematology and Oncology)  Assessment & Plan Malignant neoplasm of body of uterus, unspecified site [facility]) The patient was originally diagnosed with uterine cancer in 2020 and subsequently lost to follow-up She was subsequently evaluated and treated in [place] , records were reviewed and summarized in the oncologic history.  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 p53 stain shows mutant pattern staining.  MSI stable, MMR intact, foundation One: Low tumor mutation burden, HRD negative.  Genetic testing from January 2025 was negative  She received first cycle of combination of pembrolizumab  and lenvatinib  on July 30, 2024 which she tolerated well.  Dose of lenvatinib  was subsequently reduced due to hand-foot syndrome CT imaging in January 2026 was reviewed which showed no evidence of recurrent disease She will continue treatment as scheduled She tolerated reduced dose lenvatinib  well I plan to repeat imaging study in April Elevated liver enzymes Likely treatment related She is not symptomatic Observe only  No orders of the defined types were placed in this encounter.    Hicks Lonn, MD  INTERVAL HISTORY: she returns for treatment follow-up Complications related to previous cycle of chemotherapy included abnormal liver enzymes  PHYSICAL EXAMINATION: ECOG PERFORMANCE STATUS: 0 - Asymptomatic  Lab Results  Component Value Date    CAN125 4.3 08/20/2024      Latest Ref Rng & Units 01/13/2025    1:03 PM 12/20/2024   11:53 AM 11/19/2024   10:40 AM  CBC  WBC 4.0 - 10.5 K/uL 6.6  6.3  4.7   Hemoglobin 12.0 - 15.0 g/dL 86.8  86.7  86.1   Hematocrit 36.0 - 46.0 % 38.4  38.5  39.2   Platelets 150 - 400 K/uL 175  173  161       Chemistry      Component Value Date/Time   NA 140 01/13/2025 1303   K 3.6 01/13/2025 1303   CL 102 01/13/2025 1303   CO2 25 01/13/2025 1303   BUN 14 01/13/2025 1303   CREATININE 0.69 01/13/2025 1303      Component Value Date/Time   CALCIUM 9.8 01/13/2025 1303   ALKPHOS 45 01/13/2025 1303   AST 26 01/13/2025 1303   ALT 22 01/13/2025 1303   BILITOT 1.7 (H) 01/13/2025 1303       There were no vitals filed for this visit. There were no vitals filed for this visit. Other relevant data reviewed during this visit included CBC, CMP, CT imaging from January 2026

## 2025-01-13 NOTE — Assessment & Plan Note (Addendum)
 Likely treatment related She is not symptomatic Observe only

## 2025-01-13 NOTE — Patient Instructions (Signed)

## 2025-01-13 NOTE — Assessment & Plan Note (Addendum)
 The patient was originally diagnosed with uterine cancer in 2020 and subsequently lost to follow-up She was subsequently evaluated and treated in [place] , records were reviewed and summarized in the oncologic history.  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 p53 stain shows mutant pattern staining.  MSI stable, MMR intact, foundation One: Low tumor mutation burden, HRD negative.  Genetic testing from January 2025 was negative  She received first cycle of combination of pembrolizumab  and lenvatinib  on July 30, 2024 which she tolerated well.  Dose of lenvatinib  was subsequently reduced due to hand-foot syndrome CT imaging in January 2026 was reviewed which showed no evidence of recurrent disease She will continue treatment as scheduled She tolerated reduced dose lenvatinib  well I plan to repeat imaging study in April

## 2025-01-14 ENCOUNTER — Inpatient Hospital Stay: Admitting: Hematology and Oncology

## 2025-01-14 ENCOUNTER — Inpatient Hospital Stay

## 2025-02-04 ENCOUNTER — Inpatient Hospital Stay

## 2025-02-04 ENCOUNTER — Inpatient Hospital Stay: Admitting: Hematology and Oncology

## 2025-04-29 ENCOUNTER — Inpatient Hospital Stay: Admitting: Gynecologic Oncology
